# Patient Record
Sex: Female | Born: 1985 | Race: Black or African American | Hispanic: Yes | Marital: Single | Smoking: Never smoker
Health system: Southern US, Community
[De-identification: ages and names within clinical notes are randomized; demographics above are authoritative.]

## PROBLEM LIST (undated history)

## (undated) DIAGNOSIS — E079 Disorder of thyroid, unspecified: Secondary | ICD-10-CM

## (undated) DIAGNOSIS — F419 Anxiety disorder, unspecified: Secondary | ICD-10-CM

## (undated) HISTORY — PX: NO PAST SURGERIES: SHX2092

---

## 2015-08-05 ENCOUNTER — Emergency Department (HOSPITAL_COMMUNITY)
Admission: EM | Admit: 2015-08-05 | Discharge: 2015-08-06 | Disposition: A | Discharge status: Home / Self Care | Payer: Medicaid - Out of State | Attending: Emergency Medicine | Admitting: Emergency Medicine

## 2015-08-05 ENCOUNTER — Emergency Department (HOSPITAL_COMMUNITY): Payer: Medicaid - Out of State

## 2015-08-05 ENCOUNTER — Encounter (HOSPITAL_COMMUNITY): Payer: Self-pay | Admitting: *Deleted

## 2015-08-05 DIAGNOSIS — R42 Dizziness and giddiness: Secondary | ICD-10-CM | POA: Diagnosis not present

## 2015-08-05 DIAGNOSIS — R000 Tachycardia, unspecified: Secondary | ICD-10-CM | POA: Diagnosis not present

## 2015-08-05 DIAGNOSIS — R0600 Dyspnea, unspecified: Secondary | ICD-10-CM | POA: Insufficient documentation

## 2015-08-05 DIAGNOSIS — R079 Chest pain, unspecified: Secondary | ICD-10-CM | POA: Insufficient documentation

## 2015-08-05 DIAGNOSIS — R0602 Shortness of breath: Secondary | ICD-10-CM | POA: Diagnosis present

## 2015-08-05 DIAGNOSIS — R61 Generalized hyperhidrosis: Secondary | ICD-10-CM | POA: Insufficient documentation

## 2015-08-05 DIAGNOSIS — Z3202 Encounter for pregnancy test, result negative: Secondary | ICD-10-CM | POA: Insufficient documentation

## 2015-08-05 MED ORDER — ONDANSETRON HCL 4 MG/2ML IJ SOLN
4.0000 mg | Freq: Once | INTRAMUSCULAR | Status: AC
  Administered 2015-08-05: 4 mg via INTRAVENOUS
  Filled 2015-08-05: qty 2

## 2015-08-05 NOTE — ED Notes (Signed)
Pt complains of stabbing pain in her chest and shortness of breath since 10PM tonight. Pt states the pain is worse when she takes a deep breath.

## 2015-08-05 NOTE — ED Notes (Signed)
Patient transported to X-ray 

## 2015-08-05 NOTE — ED Notes (Signed)
Nurse drawing labs. 

## 2015-08-05 NOTE — ED Provider Notes (Signed)
CSN: [id number]     Arrival date & time 08/05/15  2230 History  This chart was scribed for Marisa Severin, MD by Doreatha Martin, ED Scribe. This patient was seen in room WA14/WA14 and the patient's care was started at 11:06 PM.    Chief Complaint  Patient presents with  . Chest Pain  . Shortness of Breath   The history is provided by the patient. No language interpreter was used.    HPI Comments: Sheri Brock is a 29 y.o. female who presents to the Emergency Department complaining of moderate, stabbing CP onset 45 minutes PTA while at rest with associated SOB, dizziness, diaphoresis. She notes a past Hx of bronchitis, but not recently. No Hx of similar symptoms. No oral contraceptive use, no FHx of PE/DVT. She notes a FHx of stroke on her grandmother's side. Pt is a non-smoker. She denies leg swelling, leg pain.  History reviewed. No pertinent past medical history. History reviewed. No pertinent past surgical history. No family history on file. Social History  Substance Use Topics  . Smoking status: Never Smoker   . Smokeless tobacco: None  . Alcohol Use: Yes   OB History    No data available     Review of Systems  Constitutional: Positive for diaphoresis.  Respiratory: Positive for shortness of breath.   Cardiovascular: Positive for chest pain. Negative for leg swelling.  Musculoskeletal: Negative for arthralgias ( No leg pain).  Neurological: Positive for dizziness.  All other systems reviewed and are negative.  Allergies  Review of patient's allergies indicates no known allergies.  Home Medications   Prior to Admission medications   Not on File   BP 93/68 mmHg  Pulse 124  Temp(Src) 98 F (36.7 C) (Oral)  Resp 28  SpO2 100%  LMP 07/10/2015 Physical Exam  Constitutional: She is oriented to person, place, and time. She appears well-developed and well-nourished.  HENT:  Head: Normocephalic and atraumatic.  Nose: Nose normal.  Mouth/Throat: Oropharynx is clear and  moist.  Eyes: Conjunctivae and EOM are normal. Pupils are equal, round, and reactive to light.  Neck: Normal range of motion. Neck supple. No JVD present. No tracheal deviation present. No thyromegaly present.  Cardiovascular: Regular rhythm, normal heart sounds and intact distal pulses.  Exam reveals no gallop and no friction rub.   No murmur heard. tachycardia  Pulmonary/Chest: Effort normal. No stridor. No respiratory distress. She has no wheezes. She has no rales. She exhibits no tenderness.  Tachypnea, diminished in bases, no wheezing, cough present  Abdominal: Soft. Bowel sounds are normal. She exhibits no distension and no mass. There is no tenderness. There is no rebound and no guarding.  Musculoskeletal: Normal range of motion. She exhibits no edema or tenderness.  Lymphadenopathy:    She has no cervical adenopathy.  Neurological: She is alert and oriented to person, place, and time. She displays normal reflexes. She exhibits normal muscle tone. Coordination normal.  Skin: Skin is warm and dry. No rash noted. No erythema. No pallor.  Psychiatric: She has a normal mood and affect. Her behavior is normal. Judgment and thought content normal.  Nursing note and vitals reviewed.   ED Course  Procedures (including critical care time) DIAGNOSTIC STUDIES: Oxygen Saturation is 100% on RA, normal by my interpretation.    COORDINATION OF CARE: 11:09 PM Discussed treatment plan with pt at bedside and pt agreed to plan.   Labs Review Labs Reviewed  BASIC METABOLIC PANEL - Abnormal; Notable for the following:  Glucose, Bld 108 (*)    Creatinine, Ser 0.42 (*)    All other components within normal limits  CBC - Abnormal; Notable for the following:    Hemoglobin 10.6 (*)    HCT 33.5 (*)    MCV 74.1 (*)    MCH 23.5 (*)    All other components within normal limits  TSH - Abnormal; Notable for the following:    TSH 0.010 (*)    All other components within normal limits  T3  T4   I-STAT TROPOININ, ED  I-STAT BETA HCG BLOOD, ED ([facility], [facility], [facility] ONLY)    Imaging Review Dg Chest 2 View  08/06/2015   CLINICAL DATA:  Left-sided chest pain and nonproductive cough for 2 hours.  EXAM: CHEST  2 VIEW  COMPARISON:  None.  FINDINGS: The cardiomediastinal contours are normal. The lungs are clear. Pulmonary vasculature is normal. No consolidation, pleural effusion, or pneumothorax. No acute osseous abnormalities are seen.  IMPRESSION: No acute pulmonary process.   Electronically Signed   By: Rubye Oaks M.D.   On: 08/06/2015 00:54   Ct Angio Chest Pe W/cm &/or Wo Cm  08/06/2015   CLINICAL DATA:  Acute onset of pleuritic chest pain. Tachycardia and dyspnea.  EXAM: CT ANGIOGRAPHY CHEST WITH CONTRAST  TECHNIQUE: Multidetector CT imaging of the chest was performed using the standard protocol during bolus administration of intravenous contrast. Multiplanar CT image reconstructions and MIPs were obtained to evaluate the vascular anatomy.  CONTRAST:  OMNIPAQUE IOHEXOL 350 MG/ML SOLN  COMPARISON:  Radiographs earlier this day.  FINDINGS: There are no filling defects within central the pulmonary arteries to suggest pulmonary embolus. The distal segmental and subsegmental branches cannot be assessed due to contrast bolus timing and soft tissue attenuation from body habitus.  The heart size is normal. Thoracic aorta is normal in caliber. There is an anterior mediastinal mass measuring 4.9 x 3.1 x 1.8 cm, irregular and slightly triangular in shape. This measures fairly homogeneous soft tissue density without calcifications or definite fat component. No mediastinal or hilar adenopathy. Small bilateral axillary lymph nodes, not enlarged by size criteria. Small right subpectoral lymph nodes.  The lungs are clear.  No consolidation, pulmonary nodule or mass.  Mild diffuse enlargement of thyroid gland without dominant nodule. Esophagus is decompressed. No acute abnormality in the included upper abdomen.   There are no acute or suspicious osseous abnormalities.  Review of the MIP images confirms the above findings.  IMPRESSION: 1. No central pulmonary embolus. The distal segmental and subsegmental branches cannot be assessed. 2. 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 This may reflect residual or recurrent thymus, thymoma, or less likely lymphoma. There are small axillary lymph nodes, otherwise no adenopathy. Follow-up CT could be considered in 3 months to evaluate for imaging stability. Alternatively, PET-CT could be considered to evaluate for metabolic activity.   Electronically Signed   By: Rubye Oaks M.D.   On: 08/06/2015 02:20   I have personally reviewed and evaluated these images and lab results as part of my medical decision-making.   EKG Interpretation   Date/Time:  Saturday August 05 2015 22:45:34 EDT Ventricular Rate:  126 PR Interval:  142 QRS Duration: 85 QT Interval:  363 QTC Calculation: 526 R Axis:   24 Text Interpretation:  Sinus tachycardia Nonspecific T abnormalities,  anterior leads Prolonged QT interval Baseline wander in lead(s) II III aVF  No old tracing to compare Confirmed by Seleny Allbright  MD, Vickye Astorino ([id number]) on  08/05/2015  10:50:00 PM      MDM   Final diagnoses:  Dyspnea  Tachycardia    I personally performed the services described in this documentation, which was scribed in my presence. The recorded information has been reviewed and is accurate.  29 yo female with acute onset of sharp pleuritic chest pain, sob, tachycardia.  Reports h/o bronchitis, does not smoke.  No specific PE risks, but has clinical picture concerning for PE.  Plan for labs, cxr, and most likely CTA chest.  No signs of PE.  Soft tissue mass possible thymus.  On re-eval, thyroid is enlarged.  Will send thyroid panel, and refer to PCP.    Marisa Severin, MD 08/06/15 [phone]

## 2015-08-06 ENCOUNTER — Emergency Department (HOSPITAL_COMMUNITY): Payer: Medicaid - Out of State

## 2015-08-06 LAB — I-STAT TROPONIN, ED: Troponin i, poc: 0 ng/mL (ref 0.00–0.08)

## 2015-08-06 LAB — TSH: TSH: 0.01 u[IU]/mL — ABNORMAL LOW (ref 0.350–4.500)

## 2015-08-06 LAB — CBC
HEMATOCRIT: 33.5 % — AB (ref 36.0–46.0)
Hemoglobin: 10.6 g/dL — ABNORMAL LOW (ref 12.0–15.0)
MCH: 23.5 pg — ABNORMAL LOW (ref 26.0–34.0)
MCHC: 31.6 g/dL (ref 30.0–36.0)
MCV: 74.1 fL — AB (ref 78.0–100.0)
PLATELETS: 258 10*3/uL (ref 150–400)
RBC: 4.52 MIL/uL (ref 3.87–5.11)
RDW: 13.3 % (ref 11.5–15.5)
WBC: 6.6 10*3/uL (ref 4.0–10.5)

## 2015-08-06 LAB — BASIC METABOLIC PANEL
Anion gap: 5 (ref 5–15)
BUN: 13 mg/dL (ref 6–20)
CHLORIDE: 107 mmol/L (ref 101–111)
CO2: 23 mmol/L (ref 22–32)
CREATININE: 0.42 mg/dL — AB (ref 0.44–1.00)
Calcium: 8.9 mg/dL (ref 8.9–10.3)
GFR calc Af Amer: >60 mL/min (ref >60)
GLUCOSE: 108 mg/dL — AB (ref 65–99)
POTASSIUM: 3.9 mmol/L (ref 3.5–5.1)
Sodium: 135 mmol/L (ref 135–145)

## 2015-08-06 LAB — I-STAT BETA HCG BLOOD, ED (MC, WL, AP ONLY): I-stat hCG, quantitative: <5 m[IU]/mL (ref <5)

## 2015-08-06 MED ORDER — KETOROLAC TROMETHAMINE 30 MG/ML IJ SOLN: 30.0000 mg | Freq: Once | INTRAMUSCULAR | Status: DC

## 2015-08-06 MED ORDER — IOHEXOL 350 MG/ML SOLN
100.0000 mL | Freq: Once | INTRAVENOUS | Status: AC | PRN
  Administered 2015-08-06: 100 mL via INTRAVENOUS

## 2015-08-06 MED ORDER — MORPHINE SULFATE (PF) 4 MG/ML IV SOLN: 4.0000 mg | Freq: Once | INTRAVENOUS | Status: DC

## 2015-08-06 MED ORDER — SODIUM CHLORIDE 0.9 % IV BOLUS (SEPSIS)
1000.0000 mL | Freq: Once | INTRAVENOUS | Status: AC
  Administered 2015-08-06: 1000 mL via INTRAVENOUS

## 2015-08-06 NOTE — Discharge Instructions (Signed)
Your workup today has not shown a specific cause for your symptoms.  Your thyroid appears large on exam today, and you had thyroid labs drawn.  These will need to be followed up by a primary care doctor.   Nonspecific Tachycardia Tachycardia is a faster than normal heartbeat (more than 100 beats per minute). In adults, the heart normally beats between 60 and 100 times a minute. A fast heartbeat may be a normal response to exercise or stress. It does not necessarily mean that something is wrong. However, sometimes when your heart beats too fast it may not be able to pump enough blood to the rest of your body. This can result in chest pain, shortness of breath, dizziness, and even fainting. Nonspecific tachycardia means that the specific cause or pattern of your tachycardia is unknown. CAUSES  Tachycardia may be harmless or it may be due to a more serious underlying cause. Possible causes of tachycardia include:  Exercise or exertion.  Fever.  Pain or injury.  Infection.  Loss of body fluids (dehydration).  Overactive thyroid.  Lack of red blood cells (anemia).  Anxiety and stress.  Alcohol.  Caffeine.  Tobacco products.  Diet pills.  Illegal drugs.  Heart disease. SYMPTOMS  Rapid or irregular heartbeat (palpitations).  Suddenly feeling your heart beating (cardiac awareness).  Dizziness.  Tiredness (fatigue).  Shortness of breath.  Chest pain.  Nausea.  Fainting. DIAGNOSIS  Your caregiver will perform a physical exam and take your medical history. In some cases, a heart specialist (cardiologist) may be consulted. Your caregiver may also order:  Blood tests.  Electrocardiography. This test records the electrical activity of your heart.  A heart monitoring test. TREATMENT  Treatment will depend on the likely cause of your tachycardia. The goal is to treat the underlying cause of your tachycardia. Treatment methods may include:  Replacement of fluids or blood  through an intravenous (IV) tube for moderate to severe dehydration or anemia.  New medicines or changes in your current medicines.  Diet and lifestyle changes.  Treatment for certain infections.  Stress relief or relaxation methods. HOME CARE INSTRUCTIONS   Rest.  Drink enough fluids to keep your urine clear or pale yellow.  Do not smoke.  Avoid:  Caffeine.  Tobacco.  Alcohol.  Chocolate.  Stimulants such as over-the-counter diet pills or pills that help you stay awake.  Situations that cause anxiety or stress.  Illegal drugs such as marijuana, phencyclidine (PCP), and cocaine.  Only take medicine as directed by your caregiver.  Keep all follow-up appointments as directed by your caregiver. SEEK IMMEDIATE MEDICAL CARE IF:   You have pain in your chest, upper arms, jaw, or neck.  You become weak, dizzy, or feel faint.  You have palpitations that will not go away.  You vomit, have diarrhea, or pass blood in your stool.  Your skin is cool, pale, and wet.  You have a fever that will not go away with rest, fluids, and medicine. MAKE SURE YOU:   Understand these instructions.  Will watch your condition.  Will get help right away if you are not doing well or get worse. Document Released: 01/02/2005 Document Revised: 02/17/2012 Document Reviewed: 11/05/2011 [facility] Patient Information 2015 [place], [place]. This information is not intended to replace advice given to you by your health care provider. Make sure you discuss any questions you have with your health care provider.  Shortness of Breath Shortness of breath means you have trouble breathing. It could also mean that  you have a medical problem. You should get immediate medical care for shortness of breath. CAUSES   Not enough oxygen in the air such as with high altitudes or a smoke-filled room.  Certain lung diseases, infections, or problems.  Heart disease or conditions, such as angina or heart  failure.  Low red blood cells (anemia).  Poor physical fitness, which can cause shortness of breath when you exercise.  Chest or back injuries or stiffness.  Being overweight.  Smoking.  Anxiety, which can make you feel like you are not getting enough air. DIAGNOSIS  Serious medical problems can often be found during your physical exam. Tests may also be done to determine why you are having shortness of breath. Tests may include:  Chest X-rays.  Lung function tests.  Blood tests.  An electrocardiogram (ECG).  An ambulatory electrocardiogram. An ambulatory ECG records your heartbeat patterns over a 24-hour period.  Exercise testing.  A transthoracic echocardiogram (TTE). During echocardiography, sound waves are used to evaluate how blood flows through your heart.  A transesophageal echocardiogram (TEE).  Imaging scans. Your health care provider may not be able to find a cause for your shortness of breath after your exam. In this case, it is important to have a follow-up exam with your health care provider as directed.  TREATMENT  Treatment for shortness of breath depends on the cause of your symptoms and can vary greatly. HOME CARE INSTRUCTIONS   Do not smoke. Smoking is a common cause of shortness of breath. If you smoke, ask for help to quit.  Avoid being around chemicals or things that may bother your breathing, such as paint fumes and dust.  Rest as needed. Slowly resume your usual activities.  If medicines were prescribed, take them as directed for the full length of time directed. This includes oxygen and any inhaled medicines.  Keep all follow-up appointments as directed by your health care provider. SEEK MEDICAL CARE IF:   Your condition does not improve in the time expected.  You have a hard time doing your normal activities even with rest.  You have any new symptoms. SEEK IMMEDIATE MEDICAL CARE IF:   Your shortness of breath gets worse.  You feel  light-headed, faint, or develop a cough not controlled with medicines.  You start coughing up blood.  You have pain with breathing.  You have chest pain or pain in your arms, shoulders, or abdomen.  You have a fever.  You are unable to walk up stairs or exercise the way you normally do. MAKE SURE YOU:  Understand these instructions.  Will watch your condition.  Will get help right away if you are not doing well or get worse. Document Released: 08/20/2001 Document Revised: 11/30/2013 Document Reviewed: 02/10/2012 [facility] Patient Information 2015 [place], [place]. This information is not intended to replace advice given to you by your health care provider. Make sure you discuss any questions you have with your health care provider.   Emergency Department Resource Guide 1) Find a Doctor and Pay Out of Pocket Although you won't have to find out who is covered by your insurance plan, it is a good idea to ask around and get recommendations. You will then need to call the office and see if the doctor you have chosen will accept you as a new patient and what types of options they offer for patients who are self-pay. Some doctors offer discounts or will set up payment plans for their patients who do not have insurance,  but you will need to ask so you aren't surprised when you get to your appointment.  2) Contact Your Local Health Department Not all health departments have doctors that can see patients for sick visits, but many do, so it is worth a call to see if yours does. If you don't know where your local health department is, you can check in your phone book. The CDC also has a tool to help you locate your state's health department, and many state websites also have listings of all of their local health departments.  3) Find a Walk-in Clinic If your illness is not likely to be very severe or complicated, you may want to try a walk in clinic. These are popping up all over the country in  pharmacies, drugstores, and shopping centers. They're usually staffed by nurse practitioners or physician assistants that have been trained to treat common illnesses and complaints. They're usually fairly quick and inexpensive. However, if you have serious medical issues or chronic medical problems, these are probably not your best option.  No Primary Care Doctor: - Call Health Connect at  [phone] - they can help you locate a primary care doctor that  accepts your insurance, provides certain services, etc. - Physician Referral Service- [phone]  Chronic Pain Problems: Organization         Address  Phone   Notes  [facility]  [phone] Patients need to be referred by their primary care doctor.   Medication Assistance: Organization         Address  Phone   Notes  [facility] Medication [facility] [address] [place] [place], [place] [address] [phone] --Must be a resident of [facility] -- Must have NO insurance coverage whatsoever (no Medicaid/ Medicare, etc.) -- The pt. MUST have a primary care doctor that directs their care regularly and follows them in the community   [facility]ist  [phone]   [employer]  [phone]    Agencies that provide inexpensive medical care: Organization         Address  Phone   Notes  [facility]  [phone]   [facility]    [phone]   [facility] [address] [place], [place] [address] [phone]   [facility] [address] [place], [place] [phone]   Planned Parenthood    [phone]   [facility]    [phone]   [facility] and [facility]  [address], [place] Phone:  [phone], Fax:  [phone] Hours of Operation:  9 am - 6 pm, M-F.  Also accepts Medicaid/Medicare and self-pay.  [facility]  [address],  [place] Phone: [phone], Fax: [phone]. Hours of Operation:  8:30 am - 5:30 pm, M-F.  Also accepts Medicaid and self-pay.  [facility] [place] [address], [place] Phone: [phone]   [facility] [address] Natasha Bence [place], [place] [phone], Ext. [phone] Mondays & Thursdays: 7-9 AM.  First 15 patients are seen on a first come, first serve basis.    Medicaid-accepting [facility] Providers:  Organization         Address  Phone   Notes  [facility] [address], [place] [phone] Also accepts self-pay patients.  [facility] [address] [place],  [address] [place]  [phone]   [facility] [address], [place] [phone]   [facility] [address], [place] [phone]   Renaye Rakers [address], [place]   [phone] Only accepts [place] Access [place] patients after they have their name applied to their card.   Self-Pay (no insurance) in [facility]:  Organization         Address  Phone   Notes  Sickle Cell Patients, [facility] Internal Medicine [address] [place], [place] [phone]   [facility] [address] [place], [place] [phone]   [facility] [place]  [address], [place] [phone]   [facility]/Dr. Osei-Bonsu  [address] [place] or [address], [place] [phone] Phone number for both [place] and [place] locations is the same.  [facility] [address] [place] [phone]   [facility] [address] [place] or [address] [phone]   [facility] [address] [place] [phone], phone; [phone], fax Sees patients 1st and 3rd Saturday of every month.  Must not  qualify for public or private insurance (i.e. Medicaid, Medicare, [place] Health Choice, Veterans' Benefits)  Household income should be no more than 200% of the poverty level The clinic cannot treat you if you are pregnant or think you are pregnant  Sexually transmitted diseases are not treated at the clinic.    Dental Care: Organization         Address  Phone  Notes  [facility] Department of [facility] [address] [place], [place] [phone] Accepts children up to age 60 who are enrolled in [place] or [place] Health Choice; pregnant women with a Medicaid card; and children who have applied for Medicaid or [place] Health Choice, but were declined, whose parents can pay a reduced fee at time of service.  [facility] Department of [facility]  [address], [place] [phone] Accepts children up to age 19 who are enrolled in [place] or [place] Health Choice; pregnant women with a Medicaid card; and children who have applied for Medicaid or  Health Choice, but were declined, whose parents can pay a reduced fee at time of service.  [facility]  [address] [place], [place] [phone] Patients are seen by appointment only. Walk-ins are not accepted. [facility] will see patients 63 years of age and older. Monday - Tuesday (8am-5pm) Most Wednesdays (8:30-5pm) $30 per visit, cash only  [facility] Adult [facility]  [address], [facility] [phone] Patients are seen by appointment only. Walk-ins are not accepted. [facility] will see patients 47 years of age and older. One Wednesday Evening (Monthly: Volunteer Based).  $30 per visit, cash only  [employer]  [phone] for adults; Children under age 66, call Graduate Pediatric Dentistry at [phone]. Children aged 57-14, please call [phone] to request a pediatric application.  Dental services are provided  in all areas of dental care including fillings, crowns and bridges, complete and partial dentures, implants, gum treatment, root canals, and extractions. Preventive care is also provided. Treatment is provided to both adults and children. Patients are selected via a  lottery and there is often a waiting list.   [facility] [address], [place]  [phone] [email or web address]   [facility] [address] [place], [place] [phone], Ext. [phone] Second and Fourth Thursday of each month, opens at 6:30 AM; Clinic ends at 9 AM.  Patients are seen on a first-come first-served basis, and a limited number are seen during each clinic.   [facility]  [address] Ether Griffins [place], [place] [phone]   Eligibility Requirements You must have lived in [place], [place], or [place] counties for at least the last three months.   You cannot be eligible for state or federal sponsored [place], including [employer], [place], or [employer].   You generally cannot be eligible for healthcare insurance through your employer.    How to apply: Eligibility screenings are held every Tuesday and Wednesday afternoon from 1:00 pm until 4:00 pm. You do not need an appointment for the interview!  [facility] [address] [place], [place] [phone]   [facility] Health Department  [phone]   [facility] Health Department  [phone]   [facility] Health Department  [phone]    Behavioral Health Resources in the Community: Intensive Outpatient Programs Organization         Address  Phone  Notes  [facility] [address] [place], [place] [phone]   [facility] Outpatient [address] [place], [place] [phone]   [facility] [address], [place], [place]  [phone]   [facility] [address]  [place], [place]  [phone] or [phone]   Substance Abuse Resources Organization         Address  Phone  Notes  [facility]  [phone]   [facility]  [phone]   [facility] [place]  [phone]   Floydene Flock  [phone]   Residential & Outpatient Substance Abuse Program  [phone]   Psychological Services Organization         Address  Phone  Notes  [facility] Behavioral Health  [phone]   [facility] Services  [phone]   [facility] Mental Health [address], [place] [phone] or [phone]    Mobile Crisis Teams Organization         Address  Phone  Notes  [facility], Mobile Crisis Care Unit  [phone]   [facility]  [address] [place], [place] [phone]   Doristine Locks [address] [place] [phone]    Self-Help/Support Groups Organization         Address  Phone             Notes  [facility] [place] - variety of support groups  [phone]- I7437963 Call for more information  Narcotics Anonymous (NA), [facility] [address], [employer] [place]  2 meetings at this location   Statistician         Address  Phone  Notes  [facility] [address],    [place]  [phone]   [facility]  [address], [place] [address], [place], [place] [phone]   [facility] [address] [place], [place] [phone] Admissions: 8am-3pm M-F  [facility] [address]    [place], [place] [phone]   [facility] [address] [place] [address], [place],  [place] [phone]   [facility] [address],  [place], [place] [phone]   [facility] - Intensive Outpatient [address] Laure[address], [place], [place] [phone]   [facility] ([facility] [address] [place]  [place], [place] [phone] or  [phone]   [facility] ([facility]) [address] [place], [place] [phone] Accepts Medicaid  [facility] [address],  [place] [phone] Substance Abuse/Addiction Treatment   [facility] Organization         Address  Phone  Notes  [facility]  [phone]   Angie Fava, PhD [address] Ervin Knack [place], [place]   [phone] or [phone]   [facility] Behavioral   [address] [place], [place] [phone]   [facility] [address], [place], [place] [phone] Insurance/Medicaid/sponsorship through [facility] and Families [address]                                    [place], [place] [phone] Therapy/tele-psych/case  [facility] [address], [place] [phone]    Dr. Lolly Mustache  [phone]   [facility] [place]  [employer] [facility] Dept. 1) [address],  2) [address], [place] 3)  [address], [place] [phone]  [phone]   [facility] Child Abuse Hotline 805-427-1061 or [phone] (After Hours)

## 2015-08-07 LAB — T4: T4 TOTAL: 22.7 ug/dL — AB (ref 4.5–12.0)

## 2015-08-07 LAB — T3: T3 TOTAL: 605 ng/dL — AB (ref 71–180)

## 2015-10-18 ENCOUNTER — Encounter: Payer: Self-pay | Admitting: Obstetrics & Gynecology

## 2016-01-08 ENCOUNTER — Emergency Department (HOSPITAL_COMMUNITY)
Admission: EM | Admit: 2016-01-08 | Discharge: 2016-01-08 | Disposition: A | Discharge status: Home / Self Care | Payer: Medicaid Other | Attending: Emergency Medicine | Admitting: Emergency Medicine

## 2016-01-08 ENCOUNTER — Emergency Department (HOSPITAL_COMMUNITY): Payer: Medicaid Other

## 2016-01-08 DIAGNOSIS — J209 Acute bronchitis, unspecified: Secondary | ICD-10-CM

## 2016-01-08 DIAGNOSIS — J0100 Acute maxillary sinusitis, unspecified: Secondary | ICD-10-CM | POA: Insufficient documentation

## 2016-01-08 DIAGNOSIS — R51 Headache: Secondary | ICD-10-CM | POA: Diagnosis present

## 2016-01-08 MED ORDER — AMOXICILLIN 500 MG PO CAPS: 500.0000 mg | ORAL_CAPSULE | Freq: Three times a day (TID) | ORAL | Status: DC

## 2016-01-08 NOTE — ED Notes (Signed)
Pt not in room when RN came to triage pt.

## 2016-01-08 NOTE — ED Provider Notes (Signed)
CSN: [id number]     Arrival date & time 01/08/16  1046 History   First MD Initiated Contact with Patient 01/08/16 1155     Chief Complaint  Patient presents with  . Hemoptysis  . Headache     (Consider location/radiation/quality/duration/timing/severity/associated sxs/prior Treatment) HPI   Sheri Brock is a 30 y.o. female presents for evaluation of rhinorrhea, bleeding from nose, cough, sputum production with bleeding, ear pain and sore throat. She's been ill for several days. She denies nausea, vomiting, weakness or dizziness. No similar in past. There are no other known modifying factors.   No past medical history on file. No past surgical history on file. No family history on file. Social History  Substance Use Topics  . Smoking status: Never Smoker   . Smokeless tobacco: Not on file  . Alcohol Use: Yes   OB History    No data available     Review of Systems  All other systems reviewed and are negative.     Allergies  Review of patient's allergies indicates no known allergies.  Home Medications   Prior to Admission medications   Not on File   BP 140/74 mmHg  Pulse 106  Temp(Src) 97.9 F (36.6 C) (Oral)  Resp 16  SpO2 100%  LMP 01/01/2016 Physical Exam  Constitutional: She is oriented to person, place, and time. She appears well-developed and well-nourished.  HENT:  Head: Normocephalic and atraumatic.  Right Ear: External ear normal.  Left Ear: External ear normal.  No tonsillar hypertrophy. Tympanic membranes are normal bilaterally. There is no material, or significant cerumen in the auditory canals. There is mild tenderness to percussion over the maxillary sinuses bilaterally.  Eyes: Conjunctivae and EOM are normal. Pupils are equal, round, and reactive to light.  Neck: Normal range of motion and phonation normal. Neck supple.  Cardiovascular: Normal rate, regular rhythm and normal heart sounds.   Pulmonary/Chest: Effort normal and breath sounds  normal. She exhibits no bony tenderness.  Abdominal: Soft. There is no tenderness.  Musculoskeletal: Normal range of motion.  Neurological: She is alert and oriented to person, place, and time. No cranial nerve deficit or sensory deficit. She exhibits normal muscle tone. Coordination normal.  Skin: Skin is warm, dry and intact.  Psychiatric: She has a normal mood and affect. Her behavior is normal. Judgment and thought content normal.  Nursing note and vitals reviewed.   ED Course  Procedures (including critical care time)  Medications - No data to display  Patient Vitals for the past 24 hrs:  BP Temp Temp src Pulse Resp SpO2  01/08/16 1057 140/74 mmHg 97.9 F (36.6 C) Oral 106 16 100 %    12:41 PM Reevaluation with update and discussion. After initial assessment and treatment, an updated evaluation reveals findings discussed with patient, all questions answered. Sheri Brock L    Labs Review Labs Reviewed - No data to display  Imaging Review Dg Chest 2 View  01/08/2016  CLINICAL DATA:  Pt c/o cough with blood, and headache, postural dizziness, dizziness in shower, right ear pain x 1 month. No chest hx. Non-smoker. EXAM: CHEST  2 VIEW COMPARISON:  08/06/2015 plain film and CT. FINDINGS: Midline trachea. Normal heart size and mediastinal contours. No pleural effusion or pneumothorax. Clear lungs. IMPRESSION: No acute cardiopulmonary disease. 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 Electronically Signed   By: Jeronimo Greaves M.D.   On: 01/08/2016 12:31   I have personally reviewed  and evaluated these images and lab results as part of my medical decision-making.   EKG Interpretation None      MDM   Final diagnoses:  Acute maxillary sinusitis, recurrence not specified  Acute bronchitis, unspecified organism    Respiratory infection, sinus, without signs for systemic illness. Bleeding with sputum, and nasal  drainage, unlikely to represent a significant blood loss. She is a nonsmoker.  Nursing Notes Reviewed/ Care Coordinated Applicable Imaging Reviewed Interpretation of Laboratory Data incorporated into ED treatment  The patient appears reasonably screened and/or stabilized for discharge and I doubt any other medical condition or other [facility] requiring further screening, evaluation, or treatment in the ED at this time prior to discharge.  Plan: Home Medications- AMOX, Sudafed; Home Treatments- rest; return here if the recommended treatment, does not improve the symptoms; Recommended follow up- PCP prn     Sheri Bale, MD 01/08/16 1244

## 2016-01-08 NOTE — ED Notes (Addendum)
Pt c/o cough with blood, and headache, postural dizziness, dizziness in shower, right ear pain x 1 month. Right TM intact, pearly gray, cone of light in appropriate position, fullness visulaized. Pt states that blood is only seen with cough. Denies chills, fevers, night sweats.  On exam, sinus tenderness present and tonsils enlarged.

## 2016-01-08 NOTE — Discharge Instructions (Signed)
Use a decongestant medications such as Sudafed, 3 or 4 times a day for nasal congestion. Get plenty of rest, and drink a lot fluids.    Acute Bronchitis Bronchitis is inflammation of the airways that extend from the windpipe into the lungs (bronchi). The inflammation often causes mucus to develop. This leads to a cough, which is the most common symptom of bronchitis.  In acute bronchitis, the condition usually develops suddenly and goes away over time, usually in a couple weeks. Smoking, allergies, and asthma can make bronchitis worse. Repeated episodes of bronchitis may cause further lung problems.  CAUSES Acute bronchitis is most often caused by the same virus that causes a cold. The virus can spread from person to person (contagious) through coughing, sneezing, and touching contaminated objects. SIGNS AND SYMPTOMS   Cough.   Fever.   Coughing up mucus.   Body aches.   Chest congestion.   Chills.   Shortness of breath.   Sore throat.  DIAGNOSIS  Acute bronchitis is usually diagnosed through a physical exam. Your health care provider will also ask you questions about your medical history. Tests, such as chest X-rays, are sometimes done to rule out other conditions.  TREATMENT  Acute bronchitis usually goes away in a couple weeks. Oftentimes, no medical treatment is necessary. Medicines are sometimes given for relief of fever or cough. Antibiotic medicines are usually not needed but may be prescribed in certain situations. In some cases, an inhaler may be recommended to help reduce shortness of breath and control the cough. A cool mist vaporizer may also be used to help thin bronchial secretions and make it easier to clear the chest.  HOME CARE INSTRUCTIONS  Get plenty of rest.   Drink enough fluids to keep your urine clear or pale yellow (unless you have a medical condition that requires fluid restriction). Increasing fluids may help thin your respiratory secretions  (sputum) and reduce chest congestion, and it will prevent dehydration.   Take medicines only as directed by your health care provider.  If you were prescribed an antibiotic medicine, finish it all even if you start to feel better.  Avoid smoking and secondhand smoke. Exposure to cigarette smoke or irritating chemicals will make bronchitis worse. If you are a smoker, consider using nicotine gum or skin patches to help control withdrawal symptoms. Quitting smoking will help your lungs heal faster.   Reduce the chances of another bout of acute bronchitis by washing your hands frequently, avoiding people with cold symptoms, and trying not to touch your hands to your mouth, nose, or eyes.   Keep all follow-up visits as directed by your health care provider.  SEEK MEDICAL CARE IF: Your symptoms do not improve after 1 week of treatment.  SEEK IMMEDIATE MEDICAL CARE IF:  You develop an increased fever or chills.   You have chest pain.   You have severe shortness of breath.  You have bloody sputum.   You develop dehydration.  You faint or repeatedly feel like you are going to pass out.  You develop repeated vomiting.  You develop a severe headache. MAKE SURE YOU:   Understand these instructions.  Will watch your condition.  Will get help right away if you are not doing well or get worse.   This information is not intended to replace advice given to you by your health care provider. Make sure you discuss any questions you have with your health care provider.   Document Released: 01/02/2005 Document Revised: 12/16/2014 Document  Reviewed: 05/18/2013 Elsevier Interactive Patient Education 2016 [employer]  Sinusitis, Adult Sinusitis is redness, soreness, and inflammation of the paranasal sinuses. Paranasal sinuses are air pockets within the bones of your face. They are located beneath your eyes, in the middle of your forehead, and above your eyes. In healthy paranasal  sinuses, mucus is able to drain out, and air is able to circulate through them by way of your nose. However, when your paranasal sinuses are inflamed, mucus and air can become trapped. This can allow bacteria and other germs to grow and cause infection. Sinusitis can develop quickly and last only a short time (acute) or continue over a long period (chronic). Sinusitis that lasts for more than 12 weeks is considered chronic. CAUSES Causes of sinusitis include:  Allergies.  Structural abnormalities, such as displacement of the cartilage that separates your nostrils (deviated septum), which can decrease the air flow through your nose and sinuses and affect sinus drainage.  Functional abnormalities, such as when the small hairs (cilia) that line your sinuses and help remove mucus do not work properly or are not present. SIGNS AND SYMPTOMS Symptoms of acute and chronic sinusitis are the same. The primary symptoms are pain and pressure around the affected sinuses. Other symptoms include:  Upper toothache.  Earache.  Headache.  Bad breath.  Decreased sense of smell and taste.  A cough, which worsens when you are lying flat.  Fatigue.  Fever.  Thick drainage from your nose, which often is green and may contain pus (purulent).  Swelling and warmth over the affected sinuses. DIAGNOSIS Your health care provider will perform a physical exam. During your exam, your health care provider may perform any of the following to help determine if you have acute sinusitis or chronic sinusitis:  Look in your nose for signs of abnormal growths in your nostrils (nasal polyps).  Tap over the affected sinus to check for signs of infection.  View the inside of your sinuses using an imaging device that has a light attached (endoscope). If your health care provider suspects that you have chronic sinusitis, one or more of the following tests may be recommended:  Allergy tests.  Nasal culture. A sample  of mucus is taken from your nose, sent to a lab, and screened for bacteria.  Nasal cytology. A sample of mucus is taken from your nose and examined by your health care provider to determine if your sinusitis is related to an allergy. TREATMENT Most cases of acute sinusitis are related to a viral infection and will resolve on their own within 10 days. Sometimes, medicines are prescribed to help relieve symptoms of both acute and chronic sinusitis. These may include pain medicines, decongestants, nasal steroid sprays, or saline sprays. However, for sinusitis related to a bacterial infection, your health care provider will prescribe antibiotic medicines. These are medicines that will help kill the bacteria causing the infection. Rarely, sinusitis is caused by a fungal infection. In these cases, your health care provider will prescribe antifungal medicine. For some cases of chronic sinusitis, surgery is needed. Generally, these are cases in which sinusitis recurs more than 3 times per year, despite other treatments. HOME CARE INSTRUCTIONS  Drink plenty of water. Water helps thin the mucus so your sinuses can drain more easily.  Use a humidifier.  Inhale steam 3-4 times a day (for example, sit in the bathroom with the shower running).  Apply a warm, moist washcloth to your face 3-4 times a day, or as directed by  your health care provider.  Use saline nasal sprays to help moisten and clean your sinuses.  Take medicines only as directed by your health care provider.  If you were prescribed either an antibiotic or antifungal medicine, finish it all even if you start to feel better. SEEK IMMEDIATE MEDICAL CARE IF:  You have increasing pain or severe headaches.  You have nausea, vomiting, or drowsiness.  You have swelling around your face.  You have vision problems.  You have a stiff neck.  You have difficulty breathing.   This information is not intended to replace advice given to you by  your health care provider. Make sure you discuss any questions you have with your health care provider.   Document Released: 11/25/2005 Document Revised: 12/16/2014 Document Reviewed: 12/10/2011 Elsevier Interactive Patient Education [employer]

## 2016-08-07 ENCOUNTER — Emergency Department (HOSPITAL_COMMUNITY)
Admission: EM | Admit: 2016-08-07 | Discharge: 2016-08-07 | Disposition: A | Discharge status: Home / Self Care | Payer: Medicaid Other | Attending: Emergency Medicine | Admitting: Emergency Medicine

## 2016-08-07 ENCOUNTER — Encounter (HOSPITAL_COMMUNITY): Payer: Self-pay | Admitting: *Deleted

## 2016-08-07 DIAGNOSIS — F410 Panic disorder [episodic paroxysmal anxiety] without agoraphobia: Secondary | ICD-10-CM | POA: Diagnosis not present

## 2016-08-07 DIAGNOSIS — R0789 Other chest pain: Secondary | ICD-10-CM | POA: Diagnosis present

## 2016-08-07 DIAGNOSIS — R000 Tachycardia, unspecified: Secondary | ICD-10-CM | POA: Insufficient documentation

## 2016-08-07 HISTORY — DX: Disorder of thyroid, unspecified: E07.9

## 2016-08-07 LAB — I-STAT TROPONIN, ED: Troponin i, poc: 0 ng/mL (ref 0.00–0.08)

## 2016-08-07 LAB — D-DIMER, QUANTITATIVE (NOT AT ARMC)

## 2016-08-07 MED ORDER — LORAZEPAM 1 MG PO TABS: 1.0000 mg | ORAL_TABLET | Freq: Three times a day (TID) | ORAL | 0 refills | Status: DC | PRN

## 2016-08-07 MED ORDER — IBUPROFEN 800 MG PO TABS
800.0000 mg | ORAL_TABLET | Freq: Once | ORAL | Status: AC
  Administered 2016-08-07: 800 mg via ORAL
  Filled 2016-08-07: qty 1

## 2016-08-07 MED ORDER — LORAZEPAM 1 MG PO TABS
1.0000 mg | ORAL_TABLET | Freq: Once | ORAL | Status: AC
  Administered 2016-08-07: 1 mg via ORAL
  Filled 2016-08-07: qty 1

## 2016-08-07 NOTE — ED Provider Notes (Signed)
[facility]-EMERGENCY DEPT Provider Note   CSN: [id number] Arrival date & time: 08/07/16  0015  By signing my name below, I, Freida Busman, attest that this documentation has been prepared under the direction and in the presence of non-physician practitioner, Antony Madura, PA-C. Electronically Signed: Freida Busman, Scribe. 08/07/2016. 1:01 AM.   History   Chief Complaint Chief Complaint  Patient presents with  . Chest Pain    HPI Sheri Brock is a 30 y.o. female.  Sheri Brock is a 30 y.o. female who presents to the Emergency Department complaining of right sided CP which began while watching a movie last night (08/06/16). She describe pressure in her chest. Pt believes she had a panic attack. She reports associated palpitations and tingling in her hands. She was also hyperventilating during this episode.  Pt had a similar episode yesterday and was evaluated by EMS who suggested she was having a panic attack. Pt denies h/o anxiety and panic attacks.  She also denies fever, nausea, and vomiting.  No recent hospitalizations or use of birth control.   Pt is also complaining of a pulsating HA at this time. She reports h/o migraines.     Chest Pain      Past Medical History:  Diagnosis Date  . Thyroid disease     There are no active problems to display for this patient.   History reviewed. No pertinent surgical history.  OB History    No data available       Home Medications    Prior to Admission medications   Medication Sig Start Date End Date Taking? Authorizing Provider  amoxicillin (AMOXIL) 500 MG capsule Take 1 capsule (500 mg total) by mouth 3 (three) times daily. 01/08/16   Mancel Bale, MD  LORazepam (ATIVAN) 1 MG tablet Take 1 tablet (1 mg total) by mouth 3 (three) times daily as needed for anxiety. 08/07/16   Antony Madura, PA-C    Family History No family history on file.  Social History Social History  Substance Use Topics  . Smoking status: Never Smoker    . Smokeless tobacco: Never Used  . Alcohol use Yes     Allergies   Review of patient's allergies indicates no known allergies.   Review of Systems Review of Systems  Cardiovascular: Positive for chest pain.  Ten systems reviewed and are negative for acute change, except as noted in the HPI.    Physical Exam Updated Vital Signs BP 142/97 (BP Location: Left Arm)   Pulse 91   Resp (!) 35   LMP 07/10/2016   SpO2 100%   Physical Exam  Constitutional: She is oriented to person, place, and time. She appears well-developed and well-nourished. No distress.  Nontoxic appearing; in NAD  HENT:  Head: Normocephalic and atraumatic.  Eyes: Conjunctivae and EOM are normal. No scleral icterus.  Neck: Normal range of motion.  Cardiovascular: Regular rhythm and intact distal pulses.   Mild tachycardia  Pulmonary/Chest: Effort normal. No respiratory distress. She has no wheezes. She has no rales.  Respirations even and unlabored. Lungs CTAB.  Musculoskeletal: Normal range of motion.  No peripheral edema  Neurological: She is alert and oriented to person, place, and time.  GCS 15. Patient moving all extremities.  Skin: Skin is warm and dry. No rash noted. She is not diaphoretic. No erythema. No pallor.  Psychiatric: She has a normal mood and affect. Her behavior is normal.  Nursing note and vitals reviewed.    ED Treatments / Results  Labs (  all labs ordered are listed, but only abnormal results are displayed) Labs Reviewed  D-DIMER, QUANTITATIVE (NOT [facility])  Rosezena SensorI-STAT TROPOININ, ED    EKG  EKG Interpretation  Date/Time:  Wednesday August 07 2016 00:27:50 EDT Ventricular Rate:  103 PR Interval:    QRS Duration: 88 QT Interval:  367 QTC Calculation: 481 R Axis:   47 Text Interpretation:  Sinus tachycardia Confirmed by [facility]  MD, APRIL ([id number]) on 08/07/2016 1:57:39 AM       Radiology No results found.  Procedures Procedures (including critical care  time)  Medications Ordered in ED Medications  ibuprofen (ADVIL,MOTRIN) tablet 800 mg (800 mg Oral Given 08/07/16 0109)  LORazepam (ATIVAN) tablet 1 mg (1 mg Oral Given 08/07/16 0109)     Initial Impression / Assessment and Plan / ED Course  I have reviewed the triage vital signs and the nursing notes.  Pertinent labs & imaging results that were available during my care of the patient were reviewed by me and considered in my medical decision making (see chart for details).  Clinical Course    30 year old female percent to the emergency department for evaluation of right-sided chest pressure. This was associated with hyperventilation. Patient had a similar episode yesterday where EMS was called out. They thought she was having a panic attack. Patient was sitting in a movie theater tonight when symptoms began. She did exhibit mild hyperventilation on arrival as well as mild tachycardia. Patient with negative troponin and nonischemic EKG. Her heart score is 0; doubt cardiac etiology. D-dimer WNL; doubt PE.  Symptoms have improved in the emergency department with Ativan. Resting heart rate has improved as well. I do believe the patient's symptoms are secondary to panic attacks. She does report increased stress in her life recently. Plan to discharge with short course of Ativan. Patient advised follow-up with a primary care doctor. Return precautions discussed and provided. Patient discharged in satisfactory condition.   Final Clinical Impressions(s) / ED Diagnoses   Final diagnoses:  Panic attack    I personally performed the services described in this documentation, which was scribed in my presence. The recorded information has been reviewed and is accurate.    New Prescriptions New Prescriptions   LORAZEPAM (ATIVAN) 1 MG TABLET    Take 1 tablet (1 mg total) by mouth 3 (three) times daily as needed for anxiety.     Antony MaduraKelly Megean Fabio, PA-C 08/07/16 [id number]    April Palumbo, MD 08/07/16  [phone]

## 2016-08-07 NOTE — ED Triage Notes (Signed)
Pt presents with right sided chest pain that began while pt was watching a movie.  Pt also reports back pain and left arm pain. Pt's partner observed pt hyperventilating.  Pt also and an episode similar to this yesterday and reports that EMS dx pt with a panic attack. Pt rates chest pain a 7 out of 10.

## 2016-08-09 ENCOUNTER — Encounter (HOSPITAL_COMMUNITY): Payer: Self-pay | Admitting: Emergency Medicine

## 2016-08-09 DIAGNOSIS — F419 Anxiety disorder, unspecified: Secondary | ICD-10-CM | POA: Insufficient documentation

## 2016-08-09 DIAGNOSIS — R0789 Other chest pain: Secondary | ICD-10-CM | POA: Diagnosis not present

## 2016-08-09 DIAGNOSIS — R51 Headache: Secondary | ICD-10-CM | POA: Insufficient documentation

## 2016-08-09 LAB — CBG MONITORING, ED: Glucose-Capillary: 108 mg/dL — ABNORMAL HIGH (ref 65–99)

## 2016-08-09 NOTE — ED Triage Notes (Signed)
Pt. reports central chest tightness with mild SOB and headache onset today . Denies nausea or diaphoresis .

## 2016-08-10 ENCOUNTER — Emergency Department (HOSPITAL_COMMUNITY): Payer: Medicaid Other

## 2016-08-10 ENCOUNTER — Emergency Department (HOSPITAL_COMMUNITY)
Admission: EM | Admit: 2016-08-10 | Discharge: 2016-08-10 | Disposition: A | Discharge status: Home / Self Care | Payer: Medicaid Other | Attending: Emergency Medicine | Admitting: Emergency Medicine

## 2016-08-10 DIAGNOSIS — R519 Headache, unspecified: Secondary | ICD-10-CM

## 2016-08-10 DIAGNOSIS — F419 Anxiety disorder, unspecified: Secondary | ICD-10-CM

## 2016-08-10 DIAGNOSIS — R51 Headache: Secondary | ICD-10-CM

## 2016-08-10 LAB — BASIC METABOLIC PANEL
ANION GAP: 8 (ref 5–15)
BUN: 10 mg/dL (ref 6–20)
CALCIUM: 9.1 mg/dL (ref 8.9–10.3)
CHLORIDE: 107 mmol/L (ref 101–111)
CO2: 23 mmol/L (ref 22–32)
CREATININE: 0.41 mg/dL — AB (ref 0.44–1.00)
GFR calc non Af Amer: >60 mL/min (ref >60)
Glucose, Bld: 115 mg/dL — ABNORMAL HIGH (ref 65–99)
Potassium: 3.5 mmol/L (ref 3.5–5.1)
SODIUM: 138 mmol/L (ref 135–145)

## 2016-08-10 LAB — CBC
HCT: 32.6 % — ABNORMAL LOW (ref 36.0–46.0)
HEMOGLOBIN: 10.5 g/dL — AB (ref 12.0–15.0)
MCH: 24.2 pg — AB (ref 26.0–34.0)
MCHC: 32.2 g/dL (ref 30.0–36.0)
MCV: 75.1 fL — ABNORMAL LOW (ref 78.0–100.0)
PLATELETS: 226 10*3/uL (ref 150–400)
RBC: 4.34 MIL/uL (ref 3.87–5.11)
RDW: 13.6 % (ref 11.5–15.5)
WBC: 10.9 10*3/uL — AB (ref 4.0–10.5)

## 2016-08-10 LAB — I-STAT TROPONIN, ED: TROPONIN I, POC: 0 ng/mL (ref 0.00–0.08)

## 2016-08-10 LAB — T4, FREE: FREE T4: 3.81 ng/dL — AB (ref 0.61–1.12)

## 2016-08-10 MED ORDER — DIPHENHYDRAMINE HCL 50 MG/ML IJ SOLN
25.0000 mg | Freq: Once | INTRAMUSCULAR | Status: AC
  Administered 2016-08-10: 25 mg via INTRAVENOUS
  Filled 2016-08-10: qty 1

## 2016-08-10 MED ORDER — METOCLOPRAMIDE HCL 5 MG/ML IJ SOLN
10.0000 mg | Freq: Once | INTRAMUSCULAR | Status: AC
  Administered 2016-08-10: 10 mg via INTRAVENOUS
  Filled 2016-08-10: qty 2

## 2016-08-10 MED ORDER — SODIUM CHLORIDE 0.9 % IV BOLUS (SEPSIS)
1000.0000 mL | Freq: Once | INTRAVENOUS | Status: AC
  Administered 2016-08-10: 1000 mL via INTRAVENOUS

## 2016-08-10 NOTE — ED Provider Notes (Signed)
[facility]-EMERGENCY DEPT Provider Note   CSN: [id number] Arrival date & time: 08/09/16  2336     History   Chief Complaint Chief Complaint  Patient presents with  . Chest Pain    HPI Cathie BeamsStephanie Cataldo is a 30 y.o. female.  HPI   This is the patient's second visit within the next couple of days for similar symptoms. She has been having anxiety, some chest tightness as well as headache. The headache has been persisting for a week and a half and has been from frontal scalp down to the back of her scalp.  She said she has some photophobia and phonophobia with a mild amount of dizziness.  She says that the chest tightness has persisted and it was evaluated two days ago but that she still has it and it is unchanged. She primary came to the ER for her headache.  She has a PCP that she just got established with and an appt coming up on Sept 26. The appointment to have her thyroid evaluated because she was told it was swollen by a healthcare provider.   She endorses anxiety, weight loss, headaches, chest wall pains.  Past Medical History:  Diagnosis Date  . Thyroid disease     There are no active problems to display for this patient.   History reviewed. No pertinent surgical history.  OB History    No data available       Home Medications    Prior to Admission medications   Medication Sig Start Date End Date Taking? Authorizing Provider  LORazepam (ATIVAN) 1 MG tablet Take 1 tablet (1 mg total) by mouth 3 (three) times daily as needed for anxiety. 08/07/16  Yes Antony MaduraKelly Humes, PA-C    Family History No family history on file.  Social History Social History  Substance Use Topics  . Smoking status: Never Smoker  . Smokeless tobacco: Never Used  . Alcohol use Yes     Allergies   Review of patient's allergies indicates no known allergies.   Review of Systems Review of Systems Review of Systems All other systems negative except as documented in the HPI. All pertinent  positives and negatives as reviewed in the HPI.   Physical Exam Updated Vital Signs BP 125/61   Pulse 91   Temp 98.1 F (36.7 C) (Oral)   Resp (!) 29   LMP 07/10/2016   SpO2 100%   Physical Exam  Constitutional: She appears well-developed and well-nourished.  HENT:  Head: Normocephalic and atraumatic.  Eyes: Conjunctivae are normal. Pupils are equal, round, and reactive to light.  Neck: Trachea normal, normal range of motion and full passive range of motion without pain. Neck supple.  Cardiovascular: Normal rate, regular rhythm and normal pulses.   Pulmonary/Chest: Effort normal and breath sounds normal. Chest wall is not dull to percussion. She exhibits no tenderness, no crepitus, no edema, no deformity and no retraction.  Abdominal: Soft. Normal appearance and bowel sounds are normal.  Musculoskeletal: Normal range of motion.  Neurological: She is alert. She has normal strength.  Cranial nerves grossly intact on exam. Pt alert and oriented x 3 Upper and lower extremity strength is symmetrical and physiologic Normal muscular tone No facial droop Coordination intact, no limb ataxia,No pronator drift  Skin: Skin is warm, dry and intact.  Psychiatric: She has a normal mood and affect. Her speech is normal and behavior is normal. Judgment and thought content normal. Cognition and memory are normal.     ED Treatments /  Results  Labs (all labs ordered are listed, but only abnormal results are displayed) Labs Reviewed  BASIC METABOLIC PANEL - Abnormal; Notable for the following:       Result Value   Glucose, Bld 115 (*)    Creatinine, Ser 0.41 (*)    All other components within normal limits  CBC - Abnormal; Notable for the following:    WBC 10.9 (*)    Hemoglobin 10.5 (*)    HCT 32.6 (*)    MCV 75.1 (*)    MCH 24.2 (*)    All other components within normal limits  CBG MONITORING, ED - Abnormal; Notable for the following:    Glucose-Capillary 108 (*)    All other  components within normal limits  TSH  T4, FREE  T4  T3  I-STAT TROPOININ, ED    EKG  EKG Interpretation None       Radiology Dg Chest 2 View  Result Date: 08/10/2016 CLINICAL DATA:  Acute onset of central chest pain. Initial encounter. EXAM: CHEST  2 VIEW COMPARISON:  Chest radiograph performed 01/08/2016 FINDINGS: The lungs are well-aerated and clear. There is no evidence of focal opacification, pleural effusion or pneumothorax. The heart is normal in size; the mediastinal contour is within normal limits. No acute osseous abnormalities are seen. IMPRESSION: No acute cardiopulmonary process seen. Electronically Signed   By: Roanna Raider M.D.   On: 08/10/2016 00:22   Ct Head Wo Contrast  Result Date: 08/10/2016 CLINICAL DATA:  Headaches for 2 weeks with intermittent blurry vision. EXAM: CT HEAD WITHOUT CONTRAST TECHNIQUE: Contiguous axial images were obtained from the base of the skull through the vertex without intravenous contrast. COMPARISON:  None. FINDINGS: Brain: Ventricles and sulci appear symmetrical. No ventricular dilatation. No mass effect or midline shift. No abnormal extra-axial fluid collections. Gray-white matter junctions are distinct. Basal cisterns are not effaced. No evidence of acute intracranial hemorrhage. Vascular: No hyperdense vessel or unexpected calcification. Skull: No depressed skull fractures. Sinuses/Orbits: No acute finding. Other: None. IMPRESSION: No acute intracranial abnormalities. Electronically Signed   By: Burman Nieves M.D.   On: 08/10/2016 03:17    Procedures Procedures (including critical care time)  Medications Ordered in ED Medications  sodium chloride 0.9 % bolus 1,000 mL (1,000 mLs Intravenous New Bag/Given 08/10/16 0313)  diphenhydrAMINE (BENADRYL) injection 25 mg (25 mg Intravenous Given 08/10/16 0316)  metoCLOPramide (REGLAN) injection 10 mg (10 mg Intravenous Given 08/10/16 0313)     Initial Impression / Assessment and Plan / ED Course   I have reviewed the triage vital signs and the nursing notes.  Pertinent labs & imaging results that were available during my care of the patient were reviewed by me and considered in my medical decision making (see chart for details).  Clinical Course    Patient requesting head CT, she is concerned about having a prolonged headache and says she will end up coming right back without it because her anxiety levels will be so high.   CT ordered of head per patient requests. Her Chest xray, CT head,CBC, BMP and troponin are all unremarkable. Her symptoms and swollen thyroid are somewhat concerning for hyperthyroidism-- I have ordered her thyroid levels to be checked before discharge so that her PCP can have them at her appt on the 26th.   Final Clinical Impressions(s) / ED Diagnoses   Final diagnoses:  Nonintractable headache, unspecified chronicity pattern, unspecified headache type  Anxiety    New Prescriptions New Prescriptions   No medications  on file     Marlon Pel, PA-C 08/10/16 1610    Geoffery Lyons, MD 08/10/16 [phone]

## 2016-08-10 NOTE — ED Notes (Signed)
Patient transported to CT 

## 2016-08-12 LAB — T4: T4 TOTAL: 15.5 ug/dL — AB (ref 4.5–12.0)

## 2016-08-12 LAB — T3: T3 TOTAL: 379 ng/dL — AB (ref 71–180)

## 2016-11-22 ENCOUNTER — Inpatient Hospital Stay (HOSPITAL_COMMUNITY)
Admission: AD | Admit: 2016-11-22 | Discharge: 2016-11-22 | Disposition: A | Discharge status: Home / Self Care | Payer: Medicaid Other | Source: Ambulatory Visit | Attending: Obstetrics and Gynecology | Admitting: Obstetrics and Gynecology

## 2016-11-22 ENCOUNTER — Encounter (HOSPITAL_COMMUNITY): Payer: Self-pay | Admitting: *Deleted

## 2016-11-22 DIAGNOSIS — N812 Incomplete uterovaginal prolapse: Secondary | ICD-10-CM | POA: Insufficient documentation

## 2016-11-22 LAB — URINALYSIS, ROUTINE W REFLEX MICROSCOPIC
BACTERIA UA: NONE SEEN
BILIRUBIN URINE: NEGATIVE
Glucose, UA: NEGATIVE mg/dL
Hgb urine dipstick: NEGATIVE
KETONES UR: NEGATIVE mg/dL
LEUKOCYTES UA: NEGATIVE
Nitrite: NEGATIVE
PROTEIN: 30 mg/dL — AB
Specific Gravity, Urine: 1.017 (ref 1.005–1.030)
pH: 6 (ref 5.0–8.0)

## 2016-11-22 LAB — POCT PREGNANCY, URINE: PREG TEST UR: NEGATIVE

## 2016-11-22 NOTE — MAU Provider Note (Signed)
Chief Complaint:  uterus coming out   First Provider Initiated Contact with Patient 11/22/16 1808       HPI: Sheri Brock is a 30 y.o. G2P1011 who presents to maternity admissions reporting feeling something protruding from vagina, thinks it is her uterus.  Has been like this for 4 months. She reports no vaginal bleeding, vaginal itching/burning, urinary symptoms, h/a, dizziness, n/v, or fever/chills.    Other  This is a new problem. The current episode started more than 1 month ago. The problem occurs intermittently. The problem has been unchanged. Pertinent negatives include no abdominal pain, chills, fever, myalgias, nausea, vomiting or weakness. The symptoms are aggravated by standing and coughing. She has tried nothing for the symptoms.   RN Note: Pt states she feels something coming out of her vagina and after looking it up she thinks it is her uterus  Past Medical History: Past Medical History:  Diagnosis Date  . Thyroid disease     Past obstetric history: OB History  Gravida Para Term Preterm AB Living  2 1 1   1 1   SAB TAB Ectopic Multiple Live Births          1    # Outcome Date GA Lbr Len/2nd Weight Sex Delivery Anes PTL Lv  2 Term 11/18/12     Vag-Spont     1 AB               Past Surgical History: Past Surgical History:  Procedure Laterality Date  . NO PAST SURGERIES      Family History: History reviewed. No pertinent family history.  Social History: Social History  Substance Use Topics  . Smoking status: Never Smoker  . Smokeless tobacco: Never Used  . Alcohol use Yes    Allergies: No Known Allergies  Meds:  Prescriptions Prior to Admission  Medication Sig Dispense Refill Last Dose  . LORazepam (ATIVAN) 1 MG tablet Take 1 tablet (1 mg total) by mouth 3 (three) times daily as needed for anxiety. (Patient not taking: Reported on 11/22/2016) 15 tablet 0 Not Taking at Unknown time    I have reviewed patient's Past Medical Hx, Surgical Hx, Family  Hx, Social Hx, medications and allergies.  ROS:  Review of Systems  Constitutional: Negative for chills and fever.  Gastrointestinal: Negative for abdominal pain, nausea and vomiting.  Musculoskeletal: Negative for myalgias.  Neurological: Negative for weakness.   Other systems negative     Physical Exam  Patient Vitals for the past 24 hrs:  BP Temp Temp src Pulse Resp SpO2  11/22/16 1749 128/62 98.2 F (36.8 C) Oral 99 18 100 %   Constitutional: Well-developed, well-nourished female in no acute distress.  Cardiovascular: normal rate and rhythm, no ectopy audible, S1 & S2 heard, no murmur Respiratory: normal effort, no distress. Lungs CTAB with no wheezes or crackles GI: Abd soft, non-tender.  Nondistended.  No rebound, No guarding.  Bowel Sounds audible  MS: Extremities nontender, no edema, normal ROM Neurologic: Alert and oriented x 4.   Grossly nonfocal. GU: Neg CVAT. Skin:  Warm and Dry Psych:  Affect appropriate.  PELVIC EXAM: Cervix is visible at introitus   Uterus is otherwise mostly well supported. Cervix replaces easily but prolapses back.  Nontender to exam.  No excoriation.   Labs: Results for orders placed or performed during the hospital encounter of 11/22/16 (from the past 24 hour(s))  Urinalysis, Routine w reflex microscopic     Status: Abnormal   Collection Time: 11/22/16  5:57 PM  Result Value Ref Range   Color, Urine YELLOW YELLOW   APPearance CLEAR CLEAR   Specific Gravity, Urine 1.017 1.005 - 1.030   pH 6.0 5.0 - 8.0   Glucose, UA NEGATIVE NEGATIVE mg/dL   Hgb urine dipstick NEGATIVE NEGATIVE   Bilirubin Urine NEGATIVE NEGATIVE   Ketones, ur NEGATIVE NEGATIVE mg/dL   Protein, ur 30 (A) NEGATIVE mg/dL   Nitrite NEGATIVE NEGATIVE   Leukocytes, UA NEGATIVE NEGATIVE   RBC / HPF 0-5 0 - 5 RBC/hpf   WBC, UA 0-5 0 - 5 WBC/hpf   Bacteria, UA NONE SEEN NONE SEEN   Squamous Epithelial / LPF 0-5 (A) NONE SEEN   Mucous PRESENT   Pregnancy, urine POC      Status: None   Collection Time: 11/22/16  5:57 PM  Result Value Ref Range   Preg Test, Ur NEGATIVE NEGATIVE      Imaging:  No results found.  MAU Course/MDM: I have ordered labs as follows:  UA, UPT Imaging ordered: none  Treatments in MAU included none.   Pt stable at time of discharge.  Assessment: 1. Cervix prolapsed into vagina     Plan: Discharge home Recommend avoid excessive straining Discussed this is usually treated with surgery or pessary. Pt may be interested in pessary Will refer for [facility] at [facility]  Follow-up Information    [facility].   Specialty:  Obstetrics and Gynecology Why:  someone will call Contact information: [address] [place] [id number] [phone]          Encouraged to return here or to other Urgent Care/ED if she develops worsening of symptoms, increase in pain, fever, or other concerning symptoms.   Wynelle BourgeoisMarie Rafael Salway CNM, MSN Certified Nurse-Midwife 11/22/2016 6:30 PM

## 2016-11-22 NOTE — Discharge Instructions (Signed)
Pelvic Organ Prolapse Introduction Pelvic organ prolapse is the stretching, bulging, or dropping of pelvic organs into an abnormal position. It happens when the muscles and tissues that surround and support pelvic structures are stretched or weak. Pelvic organ prolapse can involve:  Vagina (vaginal prolapse).  Uterus (uterine prolapse).  Bladder (cystocele).  Rectum (rectocele).  Intestines (enterocele). When organs other than the vagina are involved, they often bulge into the vagina or protrude from the vagina, depending on how severe the prolapse is. What are the causes? Causes of this condition include:  Pregnancy, labor, and childbirth.  Long-lasting (chronic) cough.  Chronic constipation.  Obesity.  Past pelvic surgery.  Aging. During and after menopause, a decreased production of the hormone estrogen can weaken pelvic ligaments and muscles.  Consistently lifting more than 50 lb (23 kg).  Buildup of fluid in the abdomen due to certain diseases and other conditions. What are the signs or symptoms? Symptoms of this condition include:  Loss of bladder control when you cough, sneeze, strain, and exercise (stress incontinence). This may be worse immediately following childbirth, and it may gradually improve over time.  Feeling pressure in your pelvis or vagina. This pressure may increase when you cough or when you are having a bowel movement.  A bulge that protrudes from the opening of your vagina or against your vaginal wall. If your uterus protrudes through the opening of your vagina and rubs against your clothing, you may also experience soreness, ulcers, infection, pain, and bleeding.  Increased effort to have a bowel movement or urinate.  Pain in your low back.  Pain, discomfort, or disinterest in sexual intercourse.  Repeated bladder infections (urinary tract infections).  Difficulty inserting or inability to insert a tampon or applicator. In some people, this  condition does not cause any symptoms. How is this diagnosed? Your health care provider may perform an internal and external vaginal and rectal exam. During the exam, you may be asked to cough and strain while you are lying down, sitting, and standing up. Your health care provider will determine if other tests are required, such as bladder function tests. How is this treated? In most cases, this condition needs to be treated only if it produces symptoms. No treatment is guaranteed to correct the prolapse or relieve the symptoms completely. Treatment may include:  Lifestyle changes, such as:  Avoiding drinking beverages that contain caffeine.  Increasing your intake of high-fiber foods. This can help to decrease constipation and straining during bowel movements.  Emptying your bladder at scheduled times (bladder training therapy). This can help to reduce or avoid urinary incontinence.  Losing weight if you are overweight or obese.  Estrogen. Estrogen may help mild prolapse by increasing the strength and tone of pelvic floor muscles.  Kegel exercises. These may help mild cases of prolapse by strengthening and tightening the muscles of the pelvic floor.  Pessary insertion. A pessary is a soft, flexible device that is placed into your vagina by your health care provider to help support the vaginal walls and keep pelvic organs in place.  Surgery. This is often the only form of treatment for severe prolapse. Different types of surgeries are available. Follow these instructions at home:  Wear a sanitary pad or absorbent product if you have urinary incontinence.  Avoid heavy lifting and straining with exercise and work. Do not hold your breath when you perform mild to moderate lifting and exercise activities. Limit your activities as directed by your health care provider.  Take   medicines only as directed by your health care provider.  Perform Kegel exercises as directed by your health care  provider.  If you have a pessary, take care of it as directed by your health care provider. Contact a health care provider if:  Your symptoms interfere with your daily activities or sex life.  You need medicine to help with the discomfort.  You notice bleeding from the vagina that is not related to your period.  You have a fever.  You have pain or bleeding when you urinate.  You have bleeding when you have a bowel movement.  You lose urine when you have sex.  You have chronic constipation.  You have a pessary that falls out.  You have vaginal discharge that has a bad smell.  You have low abdominal pain or cramping that is unusual for you. This information is not intended to replace advice given to you by your health care provider. Make sure you discuss any questions you have with your health care provider. Document Released: 06/22/2014 Document Revised: 05/02/2016 Document Reviewed: 02/07/2014  2017 Elsevier  

## 2016-11-22 NOTE — MAU Note (Signed)
Pt states she feels something coming out of her vagina and after looking it up she thinks it is her uterus.

## 2016-11-22 NOTE — MAU Note (Signed)
Pt not in lobby when called

## 2016-12-04 ENCOUNTER — Ambulatory Visit (HOSPITAL_COMMUNITY)
Admission: EM | Admit: 2016-12-04 | Discharge: 2016-12-04 | Disposition: A | Discharge status: Home / Self Care | Payer: Medicaid Other

## 2016-12-11 ENCOUNTER — Ambulatory Visit (INDEPENDENT_AMBULATORY_CARE_PROVIDER_SITE_OTHER): Payer: Medicaid Other | Admitting: Obstetrics & Gynecology

## 2016-12-11 ENCOUNTER — Encounter: Payer: Self-pay | Admitting: Obstetrics & Gynecology

## 2016-12-11 ENCOUNTER — Other Ambulatory Visit (HOSPITAL_COMMUNITY)
Admission: RE | Admit: 2016-12-11 | Discharge: 2016-12-11 | Disposition: A | Discharge status: Home / Self Care | Payer: Medicaid Other | Source: Ambulatory Visit | Attending: Obstetrics & Gynecology | Admitting: Obstetrics & Gynecology

## 2016-12-11 VITALS — BP 133/74 | HR 104 | Wt 202.9 lb

## 2016-12-11 DIAGNOSIS — Z113 Encounter for screening for infections with a predominantly sexual mode of transmission: Secondary | ICD-10-CM | POA: Diagnosis present

## 2016-12-11 DIAGNOSIS — N812 Incomplete uterovaginal prolapse: Secondary | ICD-10-CM | POA: Diagnosis not present

## 2016-12-11 DIAGNOSIS — Z01419 Encounter for gynecological examination (general) (routine) without abnormal findings: Secondary | ICD-10-CM

## 2016-12-11 DIAGNOSIS — Z1151 Encounter for screening for human papillomavirus (HPV): Secondary | ICD-10-CM | POA: Diagnosis not present

## 2016-12-11 NOTE — Progress Notes (Signed)
   Subjective:    Patient ID: Sheri BeamsStephanie Brock, female    DOB: [birth year], 31 y.o.   MRN: [id number]  HPI 31 yo S AAP1 here with a 6 week h/o feeling something bulging out of her vagina. She says that it sometimes feels uncomfortable, but not painful. She denies dyspareunia.   Review of Systems     Objective:   Physical Exam [id number] Breathing, conversing, and ambulating normally Cervix with Grade 2 prolapse, normal bimanual exam Pap smear obtained, cervix is long, appears normal       Assessment & Plan:  Preventative care- Pap with cotesting (and cultures per patient request) She declined flu vaccine Cervical prolapse- offered pessary, She is doing Kegel's exercises She will consider pessary and RTC prn

## 2016-12-13 LAB — CYTOLOGY - PAP
CHLAMYDIA, DNA PROBE: NEGATIVE
Diagnosis: NEGATIVE
HPV: NOT DETECTED
NEISSERIA GONORRHEA: NEGATIVE

## 2017-02-17 ENCOUNTER — Encounter (HOSPITAL_COMMUNITY): Payer: Self-pay | Admitting: Emergency Medicine

## 2017-02-17 ENCOUNTER — Emergency Department (HOSPITAL_COMMUNITY)
Admission: EM | Admit: 2017-02-17 | Discharge: 2017-02-17 | Disposition: A | Discharge status: Home / Self Care | Payer: Medicaid Other | Attending: Emergency Medicine | Admitting: Emergency Medicine

## 2017-02-17 DIAGNOSIS — S20152A Superficial foreign body of breast, left breast, initial encounter: Secondary | ICD-10-CM | POA: Insufficient documentation

## 2017-02-17 DIAGNOSIS — W458XXA Other foreign body or object entering through skin, initial encounter: Secondary | ICD-10-CM | POA: Insufficient documentation

## 2017-02-17 DIAGNOSIS — Y929 Unspecified place or not applicable: Secondary | ICD-10-CM | POA: Insufficient documentation

## 2017-02-17 DIAGNOSIS — Y999 Unspecified external cause status: Secondary | ICD-10-CM | POA: Diagnosis not present

## 2017-02-17 DIAGNOSIS — L089 Local infection of the skin and subcutaneous tissue, unspecified: Secondary | ICD-10-CM

## 2017-02-17 DIAGNOSIS — Y939 Activity, unspecified: Secondary | ICD-10-CM | POA: Insufficient documentation

## 2017-02-17 LAB — POC URINE PREG, ED: Preg Test, Ur: NEGATIVE

## 2017-02-17 MED ORDER — SULFAMETHOXAZOLE-TRIMETHOPRIM 800-160 MG PO TABS: 1.0000 | ORAL_TABLET | Freq: Two times a day (BID) | ORAL | 0 refills | Status: AC

## 2017-02-17 MED ORDER — CEPHALEXIN 500 MG PO CAPS: 500.0000 mg | ORAL_CAPSULE | Freq: Four times a day (QID) | ORAL | 0 refills | Status: DC

## 2017-02-17 NOTE — ED Provider Notes (Signed)
[facility]-EMERGENCY DEPT Provider Note   CSN: [id number] Arrival date & time: 02/17/17  1604  By signing my name below, I, Linna Darner, attest that this documentation has been prepared under the direction and in the presence of non-physician practitioner, [facility] M. Damian Leavell, NP. Electronically Signed: Linna Darner, Scribe. 02/17/2017. 5:20 PM.  History   Chief Complaint Chief Complaint  Patient presents with  . Breast Discharge    The history is provided by the patient. No language interpreter was used.     HPI Comments: Sheri Brock is a 31 y.o. female who presents to the Emergency Department complaining of persistent left nipple drainage beginning today. She states she had her nipples pierced in November but did not have any drainage or other problems until today. Patient doubts the possibility of pregnancy. She denies left nipple pain, adenopathy, or any other associated symptoms.  Past Medical History:  Diagnosis Date  . Thyroid disease     Patient Active Problem List   Diagnosis Date Noted  . Second degree uterine prolapse 12/11/2016    Past Surgical History:  Procedure Laterality Date  . NO PAST SURGERIES      OB History    Gravida Para Term Preterm AB Living   2 1 1   1 1    SAB TAB Ectopic Multiple Live Births           1       Home Medications    Prior to Admission medications   Medication Sig Start Date End Date Taking? Authorizing Provider  cephALEXin (KEFLEX) 500 MG capsule Take 1 capsule (500 mg total) by mouth 4 (four) times daily. 02/17/17   Hope Orlene Och, NP  LORazepam (ATIVAN) 1 MG tablet Take 1 tablet (1 mg total) by mouth 3 (three) times daily as needed for anxiety. Patient not taking: Reported on 11/22/2016 08/07/16   Antony Madura, PA-C  sulfamethoxazole-trimethoprim (BACTRIM DS,SEPTRA DS) 800-160 MG tablet Take 1 tablet by mouth 2 (two) times daily. 02/17/17 02/24/17  Hope Orlene Och, NP    Family History No family history on file.  Social  History Social History  Substance Use Topics  . Smoking status: Never Smoker  . Smokeless tobacco: Never Used  . Alcohol use Yes     Allergies   Patient has no known allergies.   Review of Systems Review of Systems  Constitutional: Negative for chills and fever.  Cardiovascular:       Drainage from left nipple.   Musculoskeletal: Negative for myalgias.  Skin: Positive for wound.  Hematological: Negative for adenopathy.    Physical Exam Updated Vital Signs BP 151/78 (BP Location: Left Arm)   Pulse 112   Temp 98.2 F (36.8 C) (Oral)   Resp 18   Ht 5\' 7"  (1.702 m)   Wt 96.6 kg   LMP 02/14/2017 (Exact Date)   SpO2 100%   BMI 33.36 kg/m   Physical Exam  Constitutional: She is oriented to person, place, and time. She appears well-developed and well-nourished. No distress.  HENT:  Head: Normocephalic and atraumatic.  Eyes: EOM are normal.  Neck: Neck supple.  Cardiovascular: Normal rate.   Pulmonary/Chest: Effort normal.    Musculoskeletal: Normal range of motion.  Left nipple: purulent drainage from piercing site.  Neurological: She is alert and oriented to person, place, and time.  Skin: Skin is warm and dry.  Psychiatric: She has a normal mood and affect. Her behavior is normal.  Nursing note and vitals reviewed.   ED Treatments /  Results  Labs (all labs ordered are listed, but only abnormal results are displayed) Labs Reviewed  POC URINE PREG, ED    Radiology No results found.  Procedures Procedures (including critical care time)  DIAGNOSTIC STUDIES: Oxygen Saturation is 100% on RA, normal by my interpretation.    COORDINATION OF CARE: 5:24 PM Discussed treatment plan with pt at bedside and pt agreed to plan.  Medications Ordered in ED Medications - No data to display   Initial Impression / Assessment and Plan / ED Course  I have reviewed the triage vital signs and the nursing notes.  Final Clinical Impressions(s) / ED Diagnoses  31 y.o.  female with yellow drainage from left nipple at piercing site stable for d/c without fever, axillary nodes or tenderness. Return precautions given. Will treat with antibiotics.  Final diagnoses:  Foreign body of breast, superficial, infected, left, initial encounter    New Prescriptions New Prescriptions   CEPHALEXIN (KEFLEX) 500 MG CAPSULE    Take 1 capsule (500 mg total) by mouth 4 (four) times daily.   SULFAMETHOXAZOLE-TRIMETHOPRIM (BACTRIM DS,SEPTRA DS) 800-160 MG TABLET    Take 1 tablet by mouth 2 (two) times daily.   I personally performed the services described in this documentation, which was scribed in my presence. The recorded information has been reviewed and is accurate.    MoodyHope M Neese, NP 02/17/17 1759    Mancel BaleElliott Wentz, MD 02/17/17 2108

## 2017-02-17 NOTE — ED Notes (Signed)
Pt stable, understands discharge instructions, and reasons for return.   

## 2017-02-17 NOTE — ED Notes (Signed)
Pt c/o left breast discharge. Pt states that it is a large amount of white liquid "like milk". Pt is not currently breast feeding. No pain at site noted. See providers assessment.

## 2017-02-17 NOTE — ED Triage Notes (Signed)
Pt st's she got her nipples pierced in Nov,.  St's today she noticed drainage from left nipple.  Pt denies, redness, soreness or pain to area

## 2017-02-17 NOTE — Discharge Instructions (Signed)
Apply warm wet compresses to the area, take the medication as directed. You may take tylenol and ibuprofen as needed for pain. Return if symptoms worsen.

## 2017-03-28 ENCOUNTER — Encounter (HOSPITAL_COMMUNITY): Payer: Self-pay | Admitting: Oncology

## 2017-03-28 DIAGNOSIS — Y92410 Unspecified street and highway as the place of occurrence of the external cause: Secondary | ICD-10-CM | POA: Insufficient documentation

## 2017-03-28 DIAGNOSIS — Y999 Unspecified external cause status: Secondary | ICD-10-CM | POA: Insufficient documentation

## 2017-03-28 DIAGNOSIS — Z5321 Procedure and treatment not carried out due to patient leaving prior to being seen by health care provider: Secondary | ICD-10-CM | POA: Insufficient documentation

## 2017-03-28 DIAGNOSIS — M545 Low back pain: Secondary | ICD-10-CM | POA: Insufficient documentation

## 2017-03-28 DIAGNOSIS — Z79899 Other long term (current) drug therapy: Secondary | ICD-10-CM | POA: Diagnosis not present

## 2017-03-28 DIAGNOSIS — Y939 Activity, unspecified: Secondary | ICD-10-CM | POA: Diagnosis not present

## 2017-03-28 DIAGNOSIS — S3992XA Unspecified injury of lower back, initial encounter: Secondary | ICD-10-CM | POA: Diagnosis present
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
## 2017-03-29 ENCOUNTER — Ambulatory Visit (HOSPITAL_COMMUNITY)
Admission: EM | Admit: 2017-03-29 | Discharge: 2017-03-29 | Disposition: A | Discharge status: Home / Self Care | Payer: Medicaid Other | Attending: Family Medicine | Admitting: Family Medicine

## 2017-03-29 ENCOUNTER — Emergency Department (HOSPITAL_COMMUNITY)
Admission: EM | Admit: 2017-03-29 | Discharge: 2017-03-29 | Disposition: A | Discharge status: Home / Self Care | Payer: Medicaid Other | Attending: Dermatology | Admitting: Dermatology

## 2017-03-29 ENCOUNTER — Encounter (HOSPITAL_COMMUNITY): Payer: Self-pay | Admitting: Emergency Medicine

## 2017-03-29 DIAGNOSIS — S161XXA Strain of muscle, fascia and tendon at neck level, initial encounter: Secondary | ICD-10-CM | POA: Diagnosis not present

## 2017-03-29 DIAGNOSIS — S39012A Strain of muscle, fascia and tendon of lower back, initial encounter: Secondary | ICD-10-CM

## 2017-03-29 MED ORDER — KETOROLAC TROMETHAMINE 60 MG/2ML IM SOLN
INTRAMUSCULAR | Status: AC
  Filled 2017-03-29: qty 2

## 2017-03-29 MED ORDER — NAPROXEN 500 MG PO TABS: 500.0000 mg | ORAL_TABLET | Freq: Two times a day (BID) | ORAL | 0 refills | Status: DC

## 2017-03-29 MED ORDER — KETOROLAC TROMETHAMINE 60 MG/2ML IM SOLN
60.0000 mg | Freq: Once | INTRAMUSCULAR | Status: AC
  Administered 2017-03-29: 60 mg via INTRAMUSCULAR

## 2017-03-29 MED ORDER — CYCLOBENZAPRINE HCL 10 MG PO TABS: 10.0000 mg | ORAL_TABLET | Freq: Two times a day (BID) | ORAL | 0 refills | Status: DC | PRN
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
## 2017-03-29 NOTE — ED Notes (Signed)
Pt called for v/s recheck, no response in from lobby
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
## 2017-06-30 ENCOUNTER — Encounter (HOSPITAL_COMMUNITY): Payer: Self-pay | Admitting: Emergency Medicine

## 2017-06-30 ENCOUNTER — Emergency Department (HOSPITAL_COMMUNITY): Payer: Medicaid Other

## 2017-06-30 ENCOUNTER — Emergency Department (HOSPITAL_COMMUNITY)
Admission: EM | Admit: 2017-06-30 | Discharge: 2017-06-30 | Disposition: A | Discharge status: Home / Self Care | Payer: Medicaid Other | Attending: Emergency Medicine | Admitting: Emergency Medicine

## 2017-06-30 DIAGNOSIS — R0789 Other chest pain: Secondary | ICD-10-CM | POA: Diagnosis present

## 2017-06-30 DIAGNOSIS — E0590 Thyrotoxicosis, unspecified without thyrotoxic crisis or storm: Secondary | ICD-10-CM | POA: Insufficient documentation

## 2017-06-30 DIAGNOSIS — Z79899 Other long term (current) drug therapy: Secondary | ICD-10-CM | POA: Diagnosis not present

## 2017-06-30 LAB — BASIC METABOLIC PANEL
Anion gap: 9 (ref 5–15)
BUN: 11 mg/dL (ref 6–20)
CHLORIDE: 108 mmol/L (ref 101–111)
CO2: 20 mmol/L — ABNORMAL LOW (ref 22–32)
Calcium: 9 mg/dL (ref 8.9–10.3)
Creatinine, Ser: 0.36 mg/dL — ABNORMAL LOW (ref 0.44–1.00)
GFR calc non Af Amer: >60 mL/min (ref >60)
Glucose, Bld: 104 mg/dL — ABNORMAL HIGH (ref 65–99)
POTASSIUM: 3.3 mmol/L — AB (ref 3.5–5.1)
SODIUM: 137 mmol/L (ref 135–145)

## 2017-06-30 LAB — CBC
HEMATOCRIT: 32.9 % — AB (ref 36.0–46.0)
HEMOGLOBIN: 10.7 g/dL — AB (ref 12.0–15.0)
MCH: 23.2 pg — ABNORMAL LOW (ref 26.0–34.0)
MCHC: 32.5 g/dL (ref 30.0–36.0)
MCV: 71.4 fL — AB (ref 78.0–100.0)
Platelets: 235 10*3/uL (ref 150–400)
RBC: 4.61 MIL/uL (ref 3.87–5.11)
RDW: 13.9 % (ref 11.5–15.5)
WBC: 6.8 10*3/uL (ref 4.0–10.5)

## 2017-06-30 LAB — I-STAT TROPONIN, ED: Troponin i, poc: 0 ng/mL (ref 0.00–0.08)

## 2017-06-30 MED ORDER — METHIMAZOLE 10 MG PO TABS
10.0000 mg | ORAL_TABLET | Freq: Once | ORAL | Status: AC
  Administered 2017-06-30: 10 mg via ORAL
  Filled 2017-06-30: qty 1

## 2017-06-30 MED ORDER — POTASSIUM CHLORIDE CRYS ER 20 MEQ PO TBCR
40.0000 meq | EXTENDED_RELEASE_TABLET | Freq: Once | ORAL | Status: AC
  Administered 2017-06-30: 40 meq via ORAL
  Filled 2017-06-30: qty 2

## 2017-06-30 MED ORDER — ATENOLOL 25 MG PO TABS: 12.5000 mg | ORAL_TABLET | Freq: Every day | ORAL | 0 refills | Status: DC

## 2017-06-30 MED ORDER — HYDROXYZINE HCL 25 MG PO TABS: 25.0000 mg | ORAL_TABLET | Freq: Three times a day (TID) | ORAL | 0 refills | Status: DC | PRN

## 2017-06-30 MED ORDER — METHIMAZOLE 10 MG PO TABS: 10.0000 mg | ORAL_TABLET | Freq: Two times a day (BID) | ORAL | 0 refills | Status: DC

## 2017-06-30 MED ORDER — ATENOLOL 12.5 MG HALF TABLET
12.5000 mg | ORAL_TABLET | Freq: Once | ORAL | Status: AC
  Administered 2017-06-30: 12.5 mg via ORAL
  Filled 2017-06-30: qty 1

## 2017-06-30 NOTE — ED Triage Notes (Addendum)
Pt c/o sharp central/left chest pain and pressure, palpitations, SOB, 3 pillow orthopnea onset last night, worse today. No edema, dizziness, blurred vision, arm or neck pain. Hx hyperthyroidism, has not been taking her prescribed medication. Hx of anxiety and panic attacks, states this does not feel like anxiety.

## 2017-06-30 NOTE — ED Provider Notes (Signed)
[facility]-EMERGENCY DEPT Provider Note   CSN: [id number] Arrival date & time: 06/30/17  0715     History   Chief Complaint Chief Complaint  Patient presents with  . Chest Pain    HPI Madisen Ludvigsen is a 31 y.o. female.  Patient c/o mid chest pain onset yesterday at rest. Pain constant, dull to sharp, non radiating, without specific exacerbating or allev factors. No pleuritic pain. No associated sob, nv or diaphoresis. No cough or uri c/o. No fever or chills. Denies recent wt change/loss. +recent stress. Hx panic attack. Denies depression. No leg pain or swelling. No hx dvt or pe.  Pt with hx hyperthyroidism/Graves - saw endocrinology 1-2 months ago, states did not go get refill of her meds for same. Does have f/u next month. No fam hx cad. No cocaine/drug use. Denies heartburn.    The history is provided by the patient.  Chest Pain   Pertinent negatives include no abdominal pain, no back pain, no cough, no fever, no headaches, no shortness of breath and no vomiting.    Past Medical History:  Diagnosis Date  . Thyroid disease     Patient Active Problem List   Diagnosis Date Noted  . Second degree uterine prolapse 12/11/2016    Past Surgical History:  Procedure Laterality Date  . NO PAST SURGERIES      OB History    Gravida Para Term Preterm AB Living   2 1 1   1 1    SAB TAB Ectopic Multiple Live Births           1       Home Medications    Prior to Admission medications   Medication Sig Start Date End Date Taking? Authorizing Provider  cephALEXin (KEFLEX) 500 MG capsule Take 1 capsule (500 mg total) by mouth 4 (four) times daily. 02/17/17   Janne Napoleon, NP  cyclobenzaprine (FLEXERIL) 10 MG tablet Take 1 tablet (10 mg total) by mouth 2 (two) times daily as needed for muscle spasms. 03/29/17   Deatra Canter, FNP  LORazepam (ATIVAN) 1 MG tablet Take 1 tablet (1 mg total) by mouth 3 (three) times daily as needed for anxiety. Patient not taking: Reported on  11/22/2016 08/07/16   Antony Madura, PA-C  naproxen (NAPROSYN) 500 MG tablet Take 1 tablet (500 mg total) by mouth 2 (two) times daily with a meal. 03/29/17   Deatra Canter, FNP    Family History History reviewed. No pertinent family history.  Social History Social History  Substance Use Topics  . Smoking status: Never Smoker  . Smokeless tobacco: Never Used  . Alcohol use Yes     Allergies   Patient has no known allergies.   Review of Systems Review of Systems  Constitutional: Negative for chills and fever.  HENT: Negative for sore throat and trouble swallowing.   Eyes: Negative for visual disturbance.  Respiratory: Negative for cough and shortness of breath.   Cardiovascular: Positive for chest pain.  Gastrointestinal: Negative for abdominal pain and vomiting.  Endocrine: Negative for polyuria.  Genitourinary: Negative for flank pain.  Musculoskeletal: Negative for back pain and neck pain.  Skin: Negative for rash.  Neurological: Negative for headaches.  Hematological: Does not bruise/bleed easily.  Psychiatric/Behavioral: Negative for confusion.     Physical Exam Updated Vital Signs BP (!) 150/94   Pulse (!) 105   Temp 98.3 F (36.8 C) (Oral)   Resp 16   LMP 05/31/2017   SpO2 100%  Physical Exam  Constitutional: She appears well-developed and well-nourished. No distress.  HENT:  Mouth/Throat: Oropharynx is clear and moist.  Eyes: Conjunctivae are normal. No scleral icterus.  Neck: Neck supple. No tracheal deviation present.  Thyroid enlarged, non tender, no nodules/mass.   Cardiovascular: Normal rate, regular rhythm, normal heart sounds and intact distal pulses.  Exam reveals no gallop and no friction rub.   No murmur heard. Pulmonary/Chest: Effort normal and breath sounds normal. No respiratory distress. She exhibits tenderness.  Abdominal: Soft. Normal appearance and bowel sounds are normal. She exhibits no distension. There is no tenderness.    Genitourinary:  Genitourinary Comments: No cva tenderness  Musculoskeletal: She exhibits no edema or tenderness.  Neurological: She is alert.  Skin: Skin is warm and dry. No rash noted.  Psychiatric: She has a normal mood and affect.  Nursing note and vitals reviewed.    ED Treatments / Results  Labs (all labs ordered are listed, but only abnormal results are displayed) Results for orders placed or performed during the hospital encounter of 06/30/17  Basic metabolic panel  Result Value Ref Range   Sodium 137 135 - 145 mmol/L   Potassium 3.3 (L) 3.5 - 5.1 mmol/L   Chloride 108 101 - 111 mmol/L   CO2 20 (L) 22 - 32 mmol/L   Glucose, Bld 104 (H) 65 - 99 mg/dL   BUN 11 6 - 20 mg/dL   Creatinine, Ser 6.96 (L) 0.44 - 1.00 mg/dL   Calcium 9.0 8.9 - 29.5 mg/dL   GFR calc non Af Amer >60 >60 mL/min   GFR calc Af Amer >60 >60 mL/min   Anion gap 9 5 - 15  CBC  Result Value Ref Range   WBC 6.8 4.0 - 10.5 K/uL   RBC 4.61 3.87 - 5.11 MIL/uL   Hemoglobin 10.7 (L) 12.0 - 15.0 g/dL   HCT 28.4 (L) 13.2 - 44.0 %   MCV 71.4 (L) 78.0 - 100.0 fL   MCH 23.2 (L) 26.0 - 34.0 pg   MCHC 32.5 30.0 - 36.0 g/dL   RDW 10.2 72.5 - 36.6 %   Platelets 235 150 - 400 K/uL  I-stat troponin, ED  Result Value Ref Range   Troponin i, poc 0.00 0.00 - 0.08 ng/mL   Comment 3           Dg Chest 2 View  Result Date: 06/30/2017 CLINICAL DATA:  Chest pain . EXAM: CHEST  2 VIEW COMPARISON:  08/09/2016 . FINDINGS: Mediastinum and hilar structures normal. Heart size normal. No pleural effusion or pneumothorax. No acute bony abnormality . IMPRESSION: No acute cardiopulmonary disease. Electronically Signed   By: Maisie Fus  Register   On: 06/30/2017 07:50    EKG  EKG Interpretation  Date/Time:  Monday June 30 2017 07:21:12 EDT Ventricular Rate:  106 PR Interval:    QRS Duration: 93 QT Interval:  361 QTC Calculation: 480 R Axis:   42 Text Interpretation:  Sinus tachycardia Borderline prolonged QT interval  Confirmed by Cathren Laine ([id number]) on 06/30/2017 8:25:01 AM       Radiology Dg Chest 2 View  Result Date: 06/30/2017 CLINICAL DATA:  Chest pain . EXAM: CHEST  2 VIEW COMPARISON:  08/09/2016 . FINDINGS: Mediastinum and hilar structures normal. Heart size normal. No pleural effusion or pneumothorax. No acute bony abnormality . IMPRESSION: No acute cardiopulmonary disease. Electronically Signed   By: Maisie Fus  Register   On: 06/30/2017 07:50    Procedures Procedures (including critical care time)  Medications Ordered in ED Medications  potassium chloride SA (K-DUR,KLOR-CON) CR tablet 40 mEq (not administered)  atenolol (TENORMIN) tablet 12.5 mg (not administered)  methimazole (TAPAZOLE) tablet 10 mg (not administered)     Initial Impression / Assessment and Plan / ED Course  I have reviewed the triage vital signs and the nursing notes.  Pertinent labs & imaging results that were available during my care of the patient were reviewed by me and considered in my medical decision making (see chart for details).  After symptoms constant x 1 day, trop neg.  Pt with chest wall tenderness on exam reproducing symptoms, ?musculoskeletal.   Pt also inquires about tx for anxiety/stress. Will give rx vistaril for home.  Reviewed nursing notes and prior charts for additional history. Reviewed endocrinology notes-  Pt supposed to be taking methimazole 10 mg bid, atenolol 12.5 -- pt has plans to f/u next month.  Pt given dose of meds in ED.  Pt currently appears stable for d/c.     Final Clinical Impressions(s) / ED Diagnoses   Final diagnoses:  None    New Prescriptions New Prescriptions   No medications on file     Cathren LaineSteinl, Macaila Tahir, MD 06/30/17 [phone]

## 2017-06-30 NOTE — ED Notes (Signed)
Unable to collect labs at this time PT in xray 

## 2017-06-30 NOTE — Discharge Instructions (Signed)
It was our pleasure to provide your ER care today - we hope that you feel better.  Take the methimazole and atenolol as prescribed for your thyroid condition.  You may take vistaril as need for anxiety - no driving when taking.  You can take acetaminophen or ibuprofen as need for pain.   Follow up with your thyroid doctor as planned in the coming month.  Return to ER if worse, new symptoms, trouble breathing, fevers, other concern.

## 2017-09-06 DIAGNOSIS — E049 Nontoxic goiter, unspecified: Secondary | ICD-10-CM | POA: Insufficient documentation

## 2018-03-04 ENCOUNTER — Emergency Department (HOSPITAL_COMMUNITY): Payer: Medicaid Other

## 2018-03-04 ENCOUNTER — Emergency Department (HOSPITAL_COMMUNITY)
Admission: EM | Admit: 2018-03-04 | Discharge: 2018-03-04 | Disposition: A | Discharge status: Home / Self Care | Payer: Medicaid Other | Attending: Emergency Medicine | Admitting: Emergency Medicine

## 2018-03-04 ENCOUNTER — Other Ambulatory Visit: Payer: Self-pay

## 2018-03-04 ENCOUNTER — Encounter (HOSPITAL_COMMUNITY): Payer: Self-pay | Admitting: Emergency Medicine

## 2018-03-04 DIAGNOSIS — F419 Anxiety disorder, unspecified: Secondary | ICD-10-CM | POA: Diagnosis present

## 2018-03-04 DIAGNOSIS — F410 Panic disorder [episodic paroxysmal anxiety] without agoraphobia: Secondary | ICD-10-CM | POA: Diagnosis not present

## 2018-03-04 DIAGNOSIS — Z79899 Other long term (current) drug therapy: Secondary | ICD-10-CM | POA: Insufficient documentation

## 2018-03-04 LAB — BASIC METABOLIC PANEL
ANION GAP: 7 (ref 5–15)
BUN: 8 mg/dL (ref 6–20)
CALCIUM: 9 mg/dL (ref 8.9–10.3)
CO2: 23 mmol/L (ref 22–32)
CREATININE: 0.4 mg/dL — AB (ref 0.44–1.00)
Chloride: 107 mmol/L (ref 101–111)
GLUCOSE: 105 mg/dL — AB (ref 65–99)
Potassium: 3.7 mmol/L (ref 3.5–5.1)
Sodium: 137 mmol/L (ref 135–145)

## 2018-03-04 LAB — CBC WITH DIFFERENTIAL/PLATELET
BASOS ABS: 0 10*3/uL (ref 0.0–0.1)
BASOS PCT: 0 %
EOS ABS: 0 10*3/uL (ref 0.0–0.7)
Eosinophils Relative: 0 %
HCT: 35.3 % — ABNORMAL LOW (ref 36.0–46.0)
Hemoglobin: 11.3 g/dL — ABNORMAL LOW (ref 12.0–15.0)
Lymphocytes Relative: 45 %
Lymphs Abs: 3 10*3/uL (ref 0.7–4.0)
MCH: 24.9 pg — ABNORMAL LOW (ref 26.0–34.0)
MCHC: 32 g/dL (ref 30.0–36.0)
MCV: 77.8 fL — ABNORMAL LOW (ref 78.0–100.0)
MONO ABS: 0.4 10*3/uL (ref 0.1–1.0)
MONOS PCT: 6 %
NEUTROS PCT: 49 %
Neutro Abs: 3.3 10*3/uL (ref 1.7–7.7)
Platelets: 248 10*3/uL (ref 150–400)
RBC: 4.54 MIL/uL (ref 3.87–5.11)
RDW: 12.4 % (ref 11.5–15.5)
WBC: 6.7 10*3/uL (ref 4.0–10.5)

## 2018-03-04 LAB — I-STAT TROPONIN, ED: TROPONIN I, POC: 0 ng/mL (ref 0.00–0.08)

## 2018-03-04 LAB — I-STAT BETA HCG BLOOD, ED (MC, WL, AP ONLY)

## 2018-03-04 LAB — TSH: TSH: <0.01 u[IU]/mL — ABNORMAL LOW (ref 0.350–4.500)

## 2018-03-04 MED ORDER — METHIMAZOLE 10 MG PO TABS: 15.0000 mg | ORAL_TABLET | Freq: Three times a day (TID) | ORAL | 0 refills | Status: DC

## 2018-03-04 MED ORDER — METHIMAZOLE 5 MG PO TABS
15.0000 mg | ORAL_TABLET | Freq: Once | ORAL | Status: AC
  Administered 2018-03-04: 15 mg via ORAL
  Filled 2018-03-04: qty 1

## 2018-03-04 MED ORDER — SODIUM CHLORIDE 0.9 % IV BOLUS
1000.0000 mL | Freq: Once | INTRAVENOUS | Status: AC
  Administered 2018-03-04: 1000 mL via INTRAVENOUS

## 2018-03-04 MED ORDER — ATENOLOL 25 MG PO TABS: 25.0000 mg | ORAL_TABLET | Freq: Every day | ORAL | 0 refills | Status: DC

## 2018-03-04 NOTE — ED Provider Notes (Signed)
[facility]-EMERGENCY DEPT Provider Note   CSN: [id number] Arrival date & time: 03/04/18  1500     History   Chief Complaint Chief Complaint  Patient presents with  . Anxiety    HPI Sheri Brock is a 32 y.o. female with a past medical history of Graves' disease, who presents to ED for evaluation of 3 episodes of chest tightness that lasted for about 10 minutes each earlier today.  First 2 episodes happened when she was sitting down filling out job applications at home.  The third and final episode happened when she was in the ambulance.  All episodes resolved with deep breathing.  She states that the symptoms were related to "panic attack" and reports history of similar symptoms in the past.  However, she is concerned because she has not had 3 episodes back to back like this.  She admits that she is not taking her home methimazole for her Luiz BlareGraves' disease in about a month.  She has not seen a PCP or endocrinologist since her hospitalization in September 2018 for thyrotoxicosis.  She denies any shortness of breath, hemoptysis, leg swelling, recent surgeries, recent prolonged travel, fever, URI symptoms, prior MI, DVT or PE, OCP use.  HPI  Past Medical History:  Diagnosis Date  . Thyroid disease     Patient Active Problem List   Diagnosis Date Noted  . Second degree uterine prolapse 12/11/2016    Past Surgical History:  Procedure Laterality Date  . NO PAST SURGERIES       OB History    Gravida  2   Para  1   Term  1   Preterm      AB  1   Living  1     SAB      TAB      Ectopic      Multiple      Live Births  1            Home Medications    Prior to Admission medications   Medication Sig Start Date End Date Taking? Authorizing Provider  Aromatic Inhalants (VICKS VAPOR IN) Inhale 1 application into the lungs daily.   Yes [provider]  Cholecalciferol (VITAMIN D PO) Take 1 tablet by mouth daily.   Yes [provider]  Multiple Vitamin (MULTIVITAMIN WITH MINERALS) TABS tablet Take 1 tablet by mouth daily.   Yes [provider]  Multiple Vitamins-Minerals (CENTRUM ADULTS PO) Take 1 tablet by mouth daily.   Yes [provider]  atenolol (TENORMIN) 25 MG tablet Take 1 tablet (25 mg total) by mouth daily for 15 days. 03/04/18 03/19/18  Erial Fikes, PA-C  cephALEXin (KEFLEX) 500 MG capsule Take 1 capsule (500 mg total) by mouth 4 (four) times daily. Patient not taking: Reported on 03/04/2018 02/17/17   Janne NapoleonNeese, Hope M, NP  cyclobenzaprine (FLEXERIL) 10 MG tablet Take 1 tablet (10 mg total) by mouth 2 (two) times daily as needed for muscle spasms. Patient not taking: Reported on 03/04/2018 03/29/17   Deatra Canterxford, William J, FNP  hydrOXYzine (ATARAX/VISTARIL) 25 MG tablet Take 1 tablet (25 mg total) by mouth every 8 (eight) hours as needed. Patient not taking: Reported on 03/04/2018 06/30/17   Cathren LaineSteinl, Kevin, MD  LORazepam (ATIVAN) 1 MG tablet Take 1 tablet (1 mg total) by mouth 3 (three) times daily as needed for anxiety. Patient not taking: Reported on 03/04/2018 08/07/16   Antony MaduraHumes, Kelly, PA-C  methimazole (TAPAZOLE) 10 MG tablet Take  1.5 tablets (15 mg total) by mouth 3 (three) times daily for 15 days. 03/04/18 03/19/18  Leza Apsey, PA-C  naproxen (NAPROSYN) 500 MG tablet Take 1 tablet (500 mg total) by mouth 2 (two) times daily with a meal. Patient not taking: Reported on 03/04/2018 03/29/17   Deatra Canter, FNP    Family History No family history on file.  Social History Social History   Tobacco Use  . Smoking status: Never Smoker  . Smokeless tobacco: Never Used  Substance Use Topics  . Alcohol use: Yes  . Drug use: No     Allergies   Patient has no known allergies.   Review of Systems Review of Systems  Constitutional: Negative for appetite change, chills and fever.  HENT: Negative for ear pain, rhinorrhea, sneezing and sore throat.   Eyes: Negative for photophobia and  visual disturbance.  Respiratory: Negative for cough, chest tightness, shortness of breath and wheezing.   Cardiovascular: Positive for chest pain and palpitations.  Gastrointestinal: Negative for abdominal pain, blood in stool, constipation, diarrhea, nausea and vomiting.  Genitourinary: Negative for dysuria, hematuria and urgency.  Musculoskeletal: Negative for myalgias.  Skin: Negative for rash.  Neurological: Negative for dizziness, weakness and light-headedness.     Physical Exam Updated Vital Signs BP (!) 151/83   Pulse 94   Temp 98.4 F (36.9 C) (Oral)   Resp 18   LMP 03/03/2018   SpO2 100%   Physical Exam  Constitutional: She appears well-developed and well-nourished. No distress.  Nontoxic appearing and in no acute distress. Speaking in complete sentences without difficulty.  HENT:  Head: Normocephalic and atraumatic.  Nose: Nose normal.  Eyes: Conjunctivae and EOM are normal. Right eye exhibits no discharge. Left eye exhibits no discharge. No scleral icterus.  Neck: Normal range of motion. Neck supple.  Cardiovascular: Normal rate, regular rhythm, normal heart sounds and intact distal pulses. Exam reveals no gallop and no friction rub.  No murmur heard. Pulmonary/Chest: Effort normal and breath sounds normal. No respiratory distress.  No chest tenderness to palpation.  Abdominal: Soft. Bowel sounds are normal. She exhibits no distension. There is no tenderness. There is no guarding.  Musculoskeletal: Normal range of motion. She exhibits no edema or tenderness.  No lower extremity edema, erythema or calf tenderness bilaterally.  Neurological: She is alert. She exhibits normal muscle tone. Coordination normal.  Skin: Skin is warm and dry. No rash noted.  Psychiatric: She has a normal mood and affect.  Nursing note and vitals reviewed.    ED Treatments / Results  Labs (all labs ordered are listed, but only abnormal results are displayed) Labs Reviewed  BASIC  METABOLIC PANEL - Abnormal; Notable for the following components:      Result Value   Glucose, Bld 105 (*)    Creatinine, Ser 0.40 (*)    All other components within normal limits  CBC WITH DIFFERENTIAL/PLATELET - Abnormal; Notable for the following components:   Hemoglobin 11.3 (*)    HCT 35.3 (*)    MCV 77.8 (*)    MCH 24.9 (*)    All other components within normal limits  TSH - Abnormal; Notable for the following components:   TSH <0.010 (*)    All other components within normal limits  T4, FREE  T4  T3  I-STAT BETA HCG BLOOD, ED ([facility], [facility], [facility] ONLY)  I-STAT TROPONIN, ED    EKG EKG Interpretation  Date/Time:  Wednesday March 04 2018 15:15:26 EDT Ventricular Rate:  98  PR Interval:    QRS Duration: 88 QT Interval:  365 QTC Calculation: 466 R Axis:   47 Text Interpretation:  Sinus rhythm LAE, consider biatrial enlargement RSR' in V1 or V2, probably normal variant Borderline T abnormalities, anterior leads similar to prior 7/18 Confirmed by Meridee Score [phone]) on 03/04/2018 6:56:54 PM   Radiology Dg Chest 2 View  Result Date: 03/04/2018 CLINICAL DATA:  Chest tightness EXAM: CHEST - 2 VIEW COMPARISON:  09/06/2017 FINDINGS: The heart size and mediastinal contours are within normal limits. Both lungs are clear. The visualized skeletal structures are unremarkable. IMPRESSION: No active cardiopulmonary disease. Electronically Signed   By: Jasmine Pang M.D.   On: 03/04/2018 20:08    Procedures Procedures (including critical care time)  Medications Ordered in ED Medications  sodium chloride 0.9 % bolus 1,000 mL (1,000 mLs Intravenous New Bag/Given 03/04/18 2133)  methimazole (TAPAZOLE) tablet 15 mg (15 mg Oral Given 03/04/18 2040)     Initial Impression / Assessment and Plan / ED Course  I have reviewed the triage vital signs and the nursing notes.  Pertinent labs & imaging results that were available during my care of the patient were reviewed by me and considered in my  medical decision making (see chart for details).     Patient, with past medical history of Graves' disease, who presents to ED for evaluation of 3 episodes of chest tightness that lasted for about 10 minutes each that occurred earlier today.  She reports the symptoms were related to a panic attack.  She admits that she has not been taking her home methimazole for Graves' disease for about a month.  She denies any current chest pain, shortness of breath, hemoptysis, prior MI, DVT or PE, OCP use.  On physical exam she is overall well-appearing.  She does not have any chest tenderness to palpation and does state that she is currently symptom-free.  She is not tachycardic or tachypneic.  She is afebrile with no recent use of antipyretics.  CBC, BMP, troponin negative.  EKG with tracing similar to prior.  TSH was low however free T4, T4 and T3 are pending at this time.  Chest x-ray negative.  Patient remained symptom-free here in the ED.  She is given a dose of her home methimazole and fluids.  We will contact case management for follow-up and medication assistance at patient states that she has been unable to afford her medications.  She is PERC negative and low risk by heart score. I doubt cardiac or pulmonary etiology, most likely due to anxiety. No signs of thyrotoxicosis. Strict return precautions given.  Portions of this note were generated with Scientist, clinical (histocompatibility and immunogenetics). Dictation errors may occur despite best attempts at proofreading.   Final Clinical Impressions(s) / ED Diagnoses   Final diagnoses:  Panic attack    ED Discharge Orders        Ordered    methimazole (TAPAZOLE) 10 MG tablet  3 times daily     03/04/18 2221    atenolol (TENORMIN) 25 MG tablet  Daily     03/04/18 2221       Dietrich Pates, PA-C 03/04/18 2227    Terrilee Files, MD 03/05/18 1235

## 2018-03-04 NOTE — Discharge Instructions (Signed)
The case manager will contact you to schedule an appointment and for medication assistance.

## 2018-03-04 NOTE — ED Notes (Signed)
PA at bedside.

## 2018-03-04 NOTE — ED Notes (Signed)
No respiratory or acute distress noted alert and oriented x 3 call light in reach pt calm in room.

## 2018-03-04 NOTE — ED Triage Notes (Signed)
Per EMS pt complaint of acute onset of anxiety with chest tightness onset 40 minutes ago while on computer; denies at present.

## 2018-03-05 ENCOUNTER — Telehealth: Payer: Self-pay | Admitting: Emergency Medicine

## 2018-03-05 LAB — T4, FREE: FREE T4: 2.93 ng/dL — AB (ref 0.61–1.12)

## 2018-03-05 NOTE — Telephone Encounter (Signed)
CM consulted for no ins and no pcp with need for follow up and medication assistance.  CM spoke with pt and discussed the [facility] and [facility].  She reports with her current job she will be receiving insurance starting in Oct and use to have Medicaid but she was dropped from their services.  Pt acknowledged understanding of follow up information and how to use the pharmacy and financial counseling at [facility].  No further CM needs noted at this time.

## 2018-03-06 LAB — T4: T4 TOTAL: 14.1 ug/dL — AB (ref 4.5–12.0)

## 2018-03-06 LAB — T3: T3 TOTAL: 312 ng/dL — AB (ref 71–180)

## 2018-03-20 ENCOUNTER — Ambulatory Visit (INDEPENDENT_AMBULATORY_CARE_PROVIDER_SITE_OTHER): Payer: Medicaid Other | Admitting: Family Medicine

## 2018-03-20 ENCOUNTER — Encounter: Payer: Self-pay | Admitting: Family Medicine

## 2018-03-20 VITALS — BP 139/74 | HR 74 | Temp 98.2°F | Resp 16 | Ht 66.0 in | Wt 215.0 lb

## 2018-03-20 DIAGNOSIS — K05219 Aggressive periodontitis, localized, unspecified severity: Secondary | ICD-10-CM

## 2018-03-20 DIAGNOSIS — E663 Overweight: Secondary | ICD-10-CM

## 2018-03-20 DIAGNOSIS — K0889 Other specified disorders of teeth and supporting structures: Secondary | ICD-10-CM

## 2018-03-20 DIAGNOSIS — E0590 Thyrotoxicosis, unspecified without thyrotoxic crisis or storm: Secondary | ICD-10-CM | POA: Diagnosis not present

## 2018-03-20 DIAGNOSIS — Z23 Encounter for immunization: Secondary | ICD-10-CM

## 2018-03-20 DIAGNOSIS — I152 Hypertension secondary to endocrine disorders: Secondary | ICD-10-CM

## 2018-03-20 LAB — POCT URINALYSIS DIPSTICK
BILIRUBIN UA: NEGATIVE
Glucose, UA: NEGATIVE
KETONES UA: NEGATIVE
Leukocytes, UA: NEGATIVE
Nitrite, UA: NEGATIVE
PROTEIN UA: 30
RBC UA: NEGATIVE
Spec Grav, UA: 1.025 (ref 1.010–1.025)
Urobilinogen, UA: 0.2 E.U./dL
pH, UA: 6 (ref 5.0–8.0)

## 2018-03-20 LAB — POCT GLYCOSYLATED HEMOGLOBIN (HGB A1C): Hemoglobin A1C: 5.4

## 2018-03-20 MED ORDER — AMOXICILLIN 500 MG PO CAPS: 500.0000 mg | ORAL_CAPSULE | Freq: Two times a day (BID) | ORAL | 0 refills | Status: AC

## 2018-03-20 MED ORDER — METHIMAZOLE 10 MG PO TABS: 15.0000 mg | ORAL_TABLET | Freq: Three times a day (TID) | ORAL | 5 refills | Status: DC

## 2018-03-20 MED ORDER — ATENOLOL 25 MG PO TABS: 25.0000 mg | ORAL_TABLET | Freq: Every day | ORAL | 5 refills | Status: DC

## 2018-03-20 MED FILL — ATENOLOL 25 MG TABLET: 25 | 30 days supply | Qty: 30 | Fill #0

## 2018-03-20 MED FILL — AMOXICILLIN 500 MG CAPSULE: 500 | 7 days supply | Qty: 14 | Fill #0

## 2018-03-20 MED FILL — methIMAzole 10 MG TABS: 10 | 15 days supply | Qty: 68 | Fill #0

## 2018-03-20 NOTE — Progress Notes (Signed)
Subjective:    Patient ID: Sheri Brock, female    DOB: [birth year], 32 y.o.   MRN: [id number]  HPI  Sheri Brock, a 32 year old female that presents to establish care. She was recently followed by Ladora Daniel, PA, endocrinology, but has not had a primary provider. Patient has a history of hyperthyroidism. She was initially diagnosed some years ago. She was taking tapzole and atenolol consistently until running out of medication 2 weeks ago.  The patient denies heart palpitations, weight loss, depression, diarrhea, constipation, heat/cold intolerance, or changes to hair and skin.  Sheri Brock is complaining of dental pain and left facial swelling for 2 weeks. Dental pain is worsened by eating. She has taking OTC Ibuprofen without sustained relief. Patient has not scheduled dental appointment due to financial constraints.     Past Medical History:  Diagnosis Date  . Thyroid disease    Social History   Socioeconomic History  . Marital status: Single    Spouse name: Not on file  . Number of children: Not on file  . Years of education: Not on file  . Highest education level: Not on file  Occupational History  . Not on file  Social Needs  . Financial resource strain: Not on file  . Food insecurity:    Worry: Not on file    Inability: Not on file  . Transportation needs:    Medical: Not on file    Non-medical: Not on file  Tobacco Use  . Smoking status: Never Smoker  . Smokeless tobacco: Never Used  Substance and Sexual Activity  . Alcohol use: Yes  . Drug use: No  . Sexual activity: Not on file  Lifestyle  . Physical activity:    Days per week: Not on file    Minutes per session: Not on file  . Stress: Not on file  Relationships  . Social connections:    Talks on phone: Not on file    Gets together: Not on file    Attends religious service: Not on file    Active member of club or organization: Not on file    Attends meetings of clubs or organizations: Not on file    Relationship status: Not on file  . Intimate partner violence:    Fear of current or ex partner: Not on file    Emotionally abused: Not on file    Physically abused: Not on file    Forced sexual activity: Not on file  Other Topics Concern  . Not on file  Social History Narrative  . Not on file    There is no immunization history on file for this patient.  Review of Systems  Constitutional: Positive for unexpected weight change.  HENT: Positive for dental problem.   Respiratory: Negative.   Cardiovascular: Negative.   Gastrointestinal: Negative.   Endocrine: Negative for cold intolerance, heat intolerance, polydipsia, polyphagia and polyuria.  Genitourinary: Negative.   Musculoskeletal: Negative.   Skin: Negative.   Allergic/Immunologic: Negative for food allergies.  Neurological: Negative.   Hematological: Negative.   Psychiatric/Behavioral: Negative.        Objective:   Physical Exam  Constitutional: She is oriented to person, place, and time. She appears well-developed and well-nourished.  HENT:  Head: Normocephalic.  Mouth/Throat: Dental caries present.  Gum erythema  Eyes: Pupils are equal, round, and reactive to light.  exophthalmus   Neck: Normal range of motion. Neck supple.  Cardiovascular: Normal rate, regular rhythm, normal heart sounds and intact distal  pulses.  Pulmonary/Chest: Effort normal and breath sounds normal.  Abdominal: Soft. Bowel sounds are normal.  Neurological: She is alert and oriented to person, place, and time.  Skin: Skin is warm and dry.  Psychiatric: She has a normal mood and affect. Her behavior is normal. Judgment and thought content normal.       BP 139/74 (BP Location: Left Arm, Patient Position: Sitting, Cuff Size: Large)   Pulse 74   Temp 98.2 F (36.8 C) (Oral)   Resp 16   Ht 5\' 6"  (1.676 m)   Wt 215 lb (97.5 kg)   LMP 03/03/2018   SpO2 100%   BMI 34.70 kg/m  Assessment & Plan:  1. Hyperthyroidism - Thyroid Panel  With TSH - atenolol (TENORMIN) 25 MG tablet; Take 1 tablet (25 mg total) by mouth daily for 15 days.  Dispense: 30 tablet; Refill: 5 - Ambulatory referral to Endocrinology - POCT urinalysis dipstick - methimazole (TAPAZOLE) 10 MG tablet; Take 1.5 tablets (15 mg total) by mouth 3 (three) times daily for 15 days.  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 Need for Tdap vaccination - Tdap vaccine greater than or equal to 7yo IM  4. Overweight Body mass index is 34.7 kg/m.  Recommend a lowfat, low carbohydrate diet divided over 5-6 small meals, increase water intake to 6-8 glasses, and 150 minutes per week of cardiovascular exercise.    - HgB A1c  5. Pain, dental Recommend Aleve 220 mg every 12 hours as needed.  Apply warm compresses to left face as needed, use interchangeably with cool compresses Given financial assistance forms, will send referral as payer source becomes available  - amoxicillin (AMOXIL) 500 MG capsule; Take 1 capsule (500 mg total) by mouth 2 (two) times daily for 7 days.  Dispense: 14 capsule; Refill: 0  6. Gum abscess - amoxicillin (AMOXIL) 500 MG capsule; Take 1 capsule (500 mg total) by mouth 2 (two) times daily for 7 days.  Dispense: 14 capsule; Refill: 0   RTC: 6 weeks for pap smear  Nolon NationsLachina Moore Hollis  MSN, FNP-C [facility] [address]  [place], [place] [id number] [phone]

## 2018-03-21 LAB — THYROID PANEL WITH TSH
Free Thyroxine Index: 2.6 (ref 1.2–4.9)
T3 UPTAKE RATIO: 26 % (ref 24–39)
T4 TOTAL: 9.9 ug/dL (ref 4.5–12.0)
TSH: <0.006 u[IU]/mL — ABNORMAL LOW (ref 0.450–4.500)

## 2018-03-21 LAB — COMPREHENSIVE METABOLIC PANEL
A/G RATIO: 1.1 — AB (ref 1.2–2.2)
ALT: 12 IU/L (ref 0–32)
AST: 13 IU/L (ref 0–40)
Albumin: 3.9 g/dL (ref 3.5–5.5)
Alkaline Phosphatase: 114 IU/L (ref 39–117)
BUN / CREAT RATIO: 27 — AB (ref 9–23)
BUN: 17 mg/dL (ref 6–20)
Bilirubin Total: <0.2 mg/dL (ref 0.0–1.2)
CALCIUM: 9.6 mg/dL (ref 8.7–10.2)
CO2: 22 mmol/L (ref 20–29)
CREATININE: 0.64 mg/dL (ref 0.57–1.00)
Chloride: 103 mmol/L (ref 96–106)
GFR calc Af Amer: 136 mL/min/{1.73_m2} (ref >59)
GFR, EST NON AFRICAN AMERICAN: 118 mL/min/{1.73_m2} (ref >59)
GLOBULIN, TOTAL: 3.7 g/dL (ref 1.5–4.5)
Glucose: 103 mg/dL — ABNORMAL HIGH (ref 65–99)
POTASSIUM: 4.7 mmol/L (ref 3.5–5.2)
SODIUM: 136 mmol/L (ref 134–144)
TOTAL PROTEIN: 7.6 g/dL (ref 6.0–8.5)

## 2018-03-24 ENCOUNTER — Telehealth: Payer: Self-pay

## 2018-03-24 NOTE — Telephone Encounter (Signed)
Called and spoke with patient. Advised that tsh is decreased and that it is important that she take the medication as prescribed every day. Advised that we are sending a referral to endocrinology and that their office will be calling to scheduled an appointment. Thanks!

## 2018-03-24 NOTE — Telephone Encounter (Signed)
-----   Message from Massie MaroonLachina M Hollis, [place] sent at 03/21/2018  6:13 AM EDT ----- Regarding: lab results Please inform patient that TSH is decreased <0.06. Take tapazole as prescribed. I have also sent a referral to endocrinology for further evaluation. Follow up in office as scheduled.   Nolon NationsLachina Moore Hollis  MSN, FNP-C Patient Care [facility] [address]  [place], [place] [id number] [phone]

## 2018-03-27 DIAGNOSIS — E0590 Thyrotoxicosis, unspecified without thyrotoxic crisis or storm: Secondary | ICD-10-CM | POA: Insufficient documentation

## 2018-03-27 DIAGNOSIS — I152 Hypertension secondary to endocrine disorders: Secondary | ICD-10-CM | POA: Insufficient documentation

## 2018-04-07 ENCOUNTER — Encounter (HOSPITAL_COMMUNITY): Payer: Self-pay | Admitting: Emergency Medicine

## 2018-04-07 ENCOUNTER — Emergency Department (HOSPITAL_COMMUNITY)
Admission: EM | Admit: 2018-04-07 | Discharge: 2018-04-07 | Disposition: A | Discharge status: Home / Self Care | Payer: Medicaid Other | Attending: Emergency Medicine | Admitting: Emergency Medicine

## 2018-04-07 ENCOUNTER — Emergency Department (HOSPITAL_COMMUNITY): Payer: Medicaid Other

## 2018-04-07 DIAGNOSIS — I10 Essential (primary) hypertension: Secondary | ICD-10-CM | POA: Insufficient documentation

## 2018-04-07 DIAGNOSIS — Z79899 Other long term (current) drug therapy: Secondary | ICD-10-CM | POA: Diagnosis not present

## 2018-04-07 DIAGNOSIS — R0789 Other chest pain: Secondary | ICD-10-CM | POA: Diagnosis not present

## 2018-04-07 DIAGNOSIS — E039 Hypothyroidism, unspecified: Secondary | ICD-10-CM | POA: Insufficient documentation

## 2018-04-07 DIAGNOSIS — R079 Chest pain, unspecified: Secondary | ICD-10-CM | POA: Diagnosis present

## 2018-04-07 HISTORY — DX: Anxiety disorder, unspecified: F41.9

## 2018-04-07 LAB — BASIC METABOLIC PANEL
Anion gap: 7 (ref 5–15)
BUN: 6 mg/dL (ref 6–20)
CALCIUM: 8.8 mg/dL — AB (ref 8.9–10.3)
CHLORIDE: 106 mmol/L (ref 101–111)
CO2: 26 mmol/L (ref 22–32)
CREATININE: 0.48 mg/dL (ref 0.44–1.00)
GFR calc Af Amer: >60 mL/min (ref >60)
Glucose, Bld: 123 mg/dL — ABNORMAL HIGH (ref 65–99)
Potassium: 4.6 mmol/L (ref 3.5–5.1)
SODIUM: 139 mmol/L (ref 135–145)

## 2018-04-07 LAB — CBC
HCT: 36.5 % (ref 36.0–46.0)
Hemoglobin: 11.6 g/dL — ABNORMAL LOW (ref 12.0–15.0)
MCH: 24.8 pg — ABNORMAL LOW (ref 26.0–34.0)
MCHC: 31.8 g/dL (ref 30.0–36.0)
MCV: 78.2 fL (ref 78.0–100.0)
PLATELETS: 316 10*3/uL (ref 150–400)
RBC: 4.67 MIL/uL (ref 3.87–5.11)
RDW: 13.7 % (ref 11.5–15.5)
WBC: 7.8 10*3/uL (ref 4.0–10.5)

## 2018-04-07 LAB — I-STAT TROPONIN, ED
TROPONIN I, POC: 0 ng/mL (ref 0.00–0.08)
Troponin i, poc: 0 ng/mL (ref 0.00–0.08)

## 2018-04-07 LAB — I-STAT BETA HCG BLOOD, ED (MC, WL, AP ONLY): I-stat hCG, quantitative: <5 m[IU]/mL (ref <5)

## 2018-04-07 NOTE — ED Triage Notes (Addendum)
Pt states she woke up this morning and starting having CP. Sharp in nature. Pt states her left arm felt numb at the time. Denies N/V. Pt appears anxious; states she has hx of panic attacks

## 2018-04-07 NOTE — Discharge Instructions (Addendum)
Continue taking medications as prescribed. Follow-up with your primary care doctor for further evaluation of your symptoms and for management of your thyroid disease. Return to the emergency room if you develop difficulty breathing, worsening/constant pain, or any new or concerning symptoms.

## 2018-04-07 NOTE — ED Notes (Signed)
Called pt for vitals re-check; no response at 13:52.

## 2018-04-07 NOTE — ED Notes (Signed)
Patient transported to X-ray 

## 2018-04-07 NOTE — ED Provider Notes (Signed)
[facility] EMERGENCY DEPARTMENT Provider Note   CSN: [id number] Arrival date & time: 04/07/18  1128     History   Chief Complaint Chief Complaint  Patient presents with  . Chest Pain    HPI Sheri Brock is a 32 y.o. female presenting for evaluation of chest pain.  Patient states that she woke up this morning with sharp central chest pain.  It improved slightly, but has returned.  She reports no associated shortness of breath this morning without associated diaphoresis, nausea, vomiting.  States that when she woke up, her left arm was numb, but this is improved.  She has a history of similar, NSAIDs related to the position she is sleeping in.  Pain does not worsen with exertion.  She denies recent fevers, chills, breath, cough, or URI symptoms.  She denies cardiac history.  She denies family history of heart problems.  She denies history of diabetes, hypertension, hyperlipidemia, she does not smoke cigarettes.  No recent travel, surgeries, immobilization, trauma, history of cancer, or history of prior DVT/PE.  She is not on OCPs.  She has a history of hypothyroidism for which she takes medication, she takes atenolol and methimazole.  She also has a history of anxiety, states this might be related.  She has been more stressed recently.  HPI  Past Medical History:  Diagnosis Date  . Anxiety   . Thyroid disease     Patient Active Problem List   Diagnosis Date Noted  . Hyperthyroidism 03/27/2018  . 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 Second degree uterine prolapse 12/11/2016    Past Surgical History:  Procedure Laterality Date  . NO PAST SURGERIES       OB History    Gravida  2   Para  1   Term  1   Preterm      AB  1   Living  1     SAB      TAB      Ectopic      Multiple      Live Births  1            Home Medications    Prior to Admission medications   Medication Sig Start Date End Date Taking? Authorizing  Provider  Aromatic Inhalants (VICKS VAPOR IN) Inhale 1 application into the lungs daily.    [provider]  atenolol (TENORMIN) 25 MG tablet Take 1 tablet (25 mg total) by mouth daily for 15 days. 03/20/18 04/04/18  Massie Maroon, FNP  methimazole (TAPAZOLE) 10 MG tablet Take 1.5 tablets (15 mg total) by mouth 3 (three) times daily for 15 days. 03/20/18 04/04/18  Massie Maroon, FNP    Family History History reviewed. No pertinent family history.  Social History Social History   Tobacco Use  . Smoking status: Never Smoker  . Smokeless tobacco: Never Used  Substance Use Topics  . Alcohol use: Yes  . Drug use: No     Allergies   Patient has no known allergies.   Review of Systems Review of Systems  Respiratory: Positive for shortness of breath (resolved).   Cardiovascular: Positive for chest pain.  Neurological: Positive for numbness (of L arm, resolved).  Psychiatric/Behavioral: The patient is nervous/anxious.   All other systems reviewed and are negative.    Physical Exam Updated Vital Signs BP (!) 157/74 (BP Location: Right Arm)   Pulse 60 Comment: VS prior to being taken back to Pod A  Temp 99.2 F (37.3 C) (Oral)   Resp 12   Ht  (1.676 m)   Wt 97.5 kg (215 lb)   LMP 03/23/2018   SpO2 100%   BMI 34.70 kg/m   Physical Exam  Constitutional: She is oriented to person, place, and time. She appears well-developed and well-nourished. No distress.  Sitting comfortably in NAD. Appears slightly anxious, especially as history continues.   HENT:  Head: Normocephalic and atraumatic.  Mouth/Throat: Uvula is midline, oropharynx is clear and moist and mucous membranes are normal.  Eyes: Pupils are equal, round, and reactive to light. Conjunctivae and EOM are normal.  Neck: Normal range of motion. Neck supple.  Cardiovascular: Normal rate, regular rhythm and intact distal pulses.  Pulmonary/Chest: Effort normal and breath sounds normal. No respiratory  distress. She has no wheezes. She exhibits tenderness.  Speaking in full sentences without difficulty. Clear lung sounds. Mild TTP of L sided chest wall anterior and posterior   Abdominal: Soft. She exhibits no distension and no mass. There is no tenderness. There is no guarding.  Musculoskeletal: Normal range of motion.  Radial and pedal pulses equal bilaterally.  No leg pain or swelling.  Neurological: She is alert and oriented to person, place, and time.  Skin: Skin is warm and dry.  Psychiatric: She has a normal mood and affect.  Nursing note and vitals reviewed.    ED Treatments / Results  Labs (all labs ordered are listed, but only abnormal results are displayed) Labs Reviewed  BASIC METABOLIC PANEL - Abnormal; Notable for the following components:      Result Value   Glucose, Bld 123 (*)    Calcium 8.8 (*)    All other components within normal limits  CBC - Abnormal; Notable for the following components:   Hemoglobin 11.6 (*)    MCH 24.8 (*)    All other components within normal limits  I-STAT TROPONIN, ED  I-STAT BETA HCG BLOOD, ED ([facility], [facility], [facility] ONLY)  I-STAT TROPONIN, ED    EKG EKG Interpretation  Date/Time:  Tuesday April 07 2018 11:32:29 EDT Ventricular Rate:  68 PR Interval:  150 QRS Duration: 76 QT Interval:  420 QTC Calculation: 446 R Axis:   71 Text Interpretation:  Normal sinus rhythm with sinus arrhythmia Anterior infarct , age undetermined Abnormal ECG No acute changes No significant change since last tracing Confirmed by Derwood Kaplan [phone]) on 04/07/2018 2:45:12 PM   Radiology Dg Chest 2 View  Result Date: 04/07/2018 CLINICAL DATA:  Onset of left-sided chest pain this morning. History of hypertension and hyperthyroidism. Nonsmoker. EXAM: CHEST - 2 VIEW COMPARISON:  PA and lateral chest x-ray of March 04, 2018 FINDINGS: The lungs are adequately inflated and clear. The heart and mediastinal structures are normal. There is no pleural effusion. The bony  thorax is unremarkable. IMPRESSION: There is no active cardiopulmonary disease. Electronically Signed   By: David  [place] M.D.   On: 04/07/2018 11:58    Procedures Procedures (including critical care time)  Medications Ordered in ED Medications - No data to display   Initial Impression / Assessment and Plan / ED Course  I have reviewed the triage vital signs and the nursing notes.  Pertinent labs & imaging results that were available during my care of the patient were reviewed by me and considered in my medical decision making (see chart for details).     Patient presenting for evaluation of chest pain.  Physical exam reassuring, she appears nontoxic and in  no apparent distress.  Tenderness to palpation with chest wall anterior and posteriorly.  History of similar pain due to anxiety, and pt concerned that this is her heart, "and she cannot leave her 64 year old without a mom".  Labs reassuring, initial troponin negative.  Electrolytes stable.  X-ray reviewed and interpreted by me, shows no pneumonia.  EKG without signs of STEMI.  Heart score of 1, low risk.  PERC negative.  At this time, doubt ACS, PE, infection.  Discussed case with attending, Dr. Rhunette Croft agrees to plan.  Will repeat troponin and plan for discharge if negative.  Repeat troponin negative. Discussed findings with patient.  Discussed follow-up with primary care for evaluation of hyperthyroidism and continued pain.  At this time, patient appears safe for discharge.  Return precautions given.  Patient states she understands and agrees to plan.  Final Clinical Impressions(s) / ED Diagnoses   Final diagnoses:  Atypical chest pain    ED Discharge Orders    None       Alveria Apley, PA-C 04/07/18 1718    Derwood Kaplan, MD 04/08/18 [phone]

## 2018-04-16 MED FILL — methIMAzole 10 MG TABS: 10 | 15 days supply | Qty: 68 | Fill #1

## 2018-04-16 MED FILL — ATENOLOL 25 MG TABLET: 25 | 30 days supply | Qty: 30 | Fill #1

## 2018-04-22 ENCOUNTER — Ambulatory Visit: Payer: Self-pay

## 2018-05-01 ENCOUNTER — Ambulatory Visit (INDEPENDENT_AMBULATORY_CARE_PROVIDER_SITE_OTHER): Payer: Medicaid Other | Admitting: Family Medicine

## 2018-05-01 VITALS — BP 135/89 | HR 67 | Temp 98.0°F | Resp 16 | Ht 66.0 in | Wt 215.0 lb

## 2018-05-01 DIAGNOSIS — F32A Depression, unspecified: Secondary | ICD-10-CM

## 2018-05-01 DIAGNOSIS — E0590 Thyrotoxicosis, unspecified without thyrotoxic crisis or storm: Secondary | ICD-10-CM | POA: Diagnosis not present

## 2018-05-01 DIAGNOSIS — F329 Major depressive disorder, single episode, unspecified: Secondary | ICD-10-CM | POA: Diagnosis not present

## 2018-05-01 DIAGNOSIS — G629 Polyneuropathy, unspecified: Secondary | ICD-10-CM | POA: Diagnosis not present

## 2018-05-01 MED ORDER — GABAPENTIN 100 MG PO CAPS: 100.0000 mg | ORAL_CAPSULE | Freq: Three times a day (TID) | ORAL | 0 refills | Status: DC

## 2018-05-01 MED FILL — GABAPENTIN 100 MG CAPSULE: 100 | 30 days supply | Qty: 90 | Fill #0

## 2018-05-01 NOTE — Patient Instructions (Addendum)
Will start a trial of gabapentin 100 mg three times daily.   Will follow up by phone with any abnormal laboratory results  [facility] [place] for counseling [phone]

## 2018-05-02 LAB — THYROID PANEL WITH TSH
FREE THYROXINE INDEX: 1.7 (ref 1.2–4.9)
T3 UPTAKE RATIO: 22 % — AB (ref 24–39)
T4, Total: 7.8 ug/dL (ref 4.5–12.0)

## 2018-05-05 ENCOUNTER — Telehealth: Payer: Self-pay

## 2018-05-05 NOTE — Progress Notes (Signed)
Subjective:    Patient ID: Sheri Brock, female    DOB: 1986/08/27, 32 y.o.   MRN: [id number]  HPI  Sheri Brock, a 32 year old female that presents for a follow up of hyperthyroidism. She is also requesting completion of FMLA forms for employer.  Sheri Brock was previously followed by Ladora Daniel, PA, endocrinology, but has been lost to follow up. She established care 1 month ago. Patient has a history of hyperthyroidism. She was initially diagnosed some years ago. Sheri Brock has been taking tapazole and atenolol consistently since establishing care.  The patient denies heart palpitations, weight loss, diarrhea, constipation, heat/cold intolerance, or changes to hair and skin. Patient endorses neuropathy to lower extremities.   Sheri Brock is also complaining of depression. Symptoms include hopelessness, anhedonia, and insomnia. She says that symptoms are present on most days.  She denies a history of depression. She also denies suicidal or homicidal ideations.     Past Medical History:  Diagnosis Date  . Anxiety   . Thyroid disease    Social History   Socioeconomic History  . Marital status: Single    Spouse name: Not on file  . Number of children: Not on file  . Years of education: Not on file  . Highest education level: Not on file  Occupational History  . Not on file  Social Needs  . Financial resource strain: Not on file  . Food insecurity:    Worry: Not on file    Inability: Not on file  . Transportation needs:    Medical: Not on file    Non-medical: Not on file  Tobacco Use  . Smoking status: Never Smoker  . Smokeless tobacco: Never Used  Substance and Sexual Activity  . Alcohol use: Yes  . Drug use: No  . Sexual activity: Not on file  Lifestyle  . Physical activity:    Days per week: Not on file    Minutes per session: Not on file  . Stress: Not on file  Relationships  . Social connections:    Talks on phone: Not on file    Gets together: Not on file   Attends religious service: Not on file    Active member of club or organization: Not on file    Attends meetings of clubs or organizations: Not on file    Relationship status: Not on file  . Intimate partner violence:    Fear of current or ex partner: Not on file    Emotionally abused: Not on file    Physically abused: Not on file    Forced sexual activity: Not on file  Other Topics Concern  . Not on file  Social History Narrative  . Not on file   Immunization History  Administered Date(s) Administered  . Tdap 03/20/2018    Review of Systems  Constitutional: Positive for unexpected weight change.  HENT: Positive for dental problem.   Respiratory: Negative.   Cardiovascular: Negative.   Gastrointestinal: Negative.   Endocrine: Negative for cold intolerance, heat intolerance, polydipsia, polyphagia and polyuria.  Genitourinary: Negative.   Musculoskeletal: Negative.   Skin: Negative.   Allergic/Immunologic: Negative for food allergies.  Neurological: Negative.   Hematological: Negative.   Psychiatric/Behavioral: Positive for sleep disturbance.       Depression       Objective:   Physical Exam  Constitutional: She is oriented to person, place, and time. She appears well-developed and well-nourished.  HENT:  Head: Normocephalic.  Eyes: Pupils are equal, round, and  reactive to light.  exophthalmus   Neck: Normal range of motion. Neck supple.  Cardiovascular: Normal rate, regular rhythm, normal heart sounds and intact distal pulses.  Pulmonary/Chest: Effort normal and breath sounds normal.  Abdominal: Soft. Bowel sounds are normal.  Neurological: She is alert and oriented to person, place, and time.  Skin: Skin is warm and dry.  Psychiatric: She has a normal mood and affect. Her behavior is normal. Judgment and thought content normal.       BP 135/89 (BP Location: Left Arm, Patient Position: Sitting, Cuff Size: Normal)   Pulse 67   Temp 98 F (36.7 C) (Oral)   Resp  16   Ht  (1.676 m)   Wt 215 lb (97.5 kg)   LMP 04/27/2018   SpO2 100%   BMI 34.70 kg/m  Assessment & Plan:  1. Hyperthyroidism Follow up with endocrinology as scheduled.  - Thyroid Panel With TSH  2. Depression, unspecified depression type Pt is not interested in starting anti depression medications at this time.    Office Visit from 05/01/2018 in [facility]  PHQ-9 Total Score  10     [facility] for counseling.   3. Neuropathy - gabapentin (NEURONTIN) 100 MG capsule; Take 1 capsule (100 mg total) by mouth 3 (three) times daily.  Dispense: 90 capsule; Refill: 0   RTC: 4 weeks for neuropathy   Nolon Nations  MSN, FNP-C Patient Care [facility] [address] [place], [place] [address] [phone]

## 2018-05-05 NOTE — Telephone Encounter (Signed)
-----   Message from Massie Maroon, [place] sent at 05/05/2018  5:56 AM EDT ----- Regarding: lab results TSH remains decreased, which is consistent with hyperthyroidism. Please schedule first available appointment with Dr. Everardo All, endocrinologist and call patient with appointment time. Advise patient to continue to take prescribed medications consistently.     Nolon Nations  MSN, FNP-C Patient Care [facility] [address] [place], [place] [address] [phone]

## 2018-05-05 NOTE — Telephone Encounter (Signed)
Called and spoke with patient, Sheri Brock that TSH remains decreased. She is scheduled with Dr. Everardo All on 05/13/2018 and was Sheri Brock to keep this appointment. Asked that she continue to take all medications consistently and keep next scheduled appointment with our office. Thanks!

## 2018-05-06 ENCOUNTER — Encounter: Payer: Self-pay | Admitting: Family Medicine

## 2018-05-07 ENCOUNTER — Ambulatory Visit (INDEPENDENT_AMBULATORY_CARE_PROVIDER_SITE_OTHER): Payer: Medicaid Other | Admitting: Family Medicine

## 2018-05-07 ENCOUNTER — Encounter: Payer: Self-pay | Admitting: Family Medicine

## 2018-05-07 VITALS — BP 140/68 | HR 48 | Temp 98.2°F | Resp 16 | Ht 66.0 in | Wt 221.0 lb

## 2018-05-07 DIAGNOSIS — G8929 Other chronic pain: Secondary | ICD-10-CM | POA: Diagnosis not present

## 2018-05-07 DIAGNOSIS — K047 Periapical abscess without sinus: Secondary | ICD-10-CM

## 2018-05-07 DIAGNOSIS — K089 Disorder of teeth and supporting structures, unspecified: Secondary | ICD-10-CM

## 2018-05-07 MED ORDER — SACCHAROMYCES BOULARDII 250 MG PO CAPS: 250.0000 mg | ORAL_CAPSULE | Freq: Two times a day (BID) | ORAL | 0 refills | Status: DC

## 2018-05-07 MED ORDER — CLINDAMYCIN HCL 150 MG PO CAPS: 150.0000 mg | ORAL_CAPSULE | Freq: Three times a day (TID) | ORAL | 0 refills | Status: DC

## 2018-05-07 MED ORDER — IBUPROFEN 600 MG PO TABS: 600.0000 mg | ORAL_TABLET | Freq: Three times a day (TID) | ORAL | 0 refills | Status: DC | PRN

## 2018-05-07 MED FILL — methIMAzole 10 MG TABS: 10 | 15 days supply | Qty: 68 | Fill #2

## 2018-05-07 MED FILL — IBUPROFEN 600 MG TABLET: 600 | 10 days supply | Qty: 30 | Fill #0

## 2018-05-07 MED FILL — CLINDAMYCIN HCL 150 MG CAPS: 150 | 7 days supply | Qty: 21 | Fill #0

## 2018-05-07 NOTE — Progress Notes (Signed)
History of Present Illness  Sheri Brock, a 32 year old female with a history of hypothyroidism presents complaining of dental pain and right facial swelling.  Patient complaining of dental abscess over the past several months.  She has been evaluated in office and in the emergency department for this problem.  Patient has not establish care with a dentist since relocating to this area.  Current pain intensity is 2-3/10.  Patient last had over-the-counter ibuprofen on last night around 10 PM with moderate relief.  She denies headache, chest pain, dizziness, nausea, vomiting, or diarrhea.  Pain is aggravated by hot/cold food items.  Past Medical History:  Diagnosis Date  . Anxiety   . Thyroid disease    No family history on file. Scheduled Meds: Continuous Infusions: PRN Meds:  No Known Allergies Social History   Socioeconomic History  . Marital status: Single    Spouse name: Not on file  . Number of children: Not on file  . Years of education: Not on file  . Highest education level: Not on file  Occupational History  . Not on file  Social Needs  . Financial resource strain: Not on file  . Food insecurity:    Worry: Not on file    Inability: Not on file  . Transportation needs:    Medical: Not on file    Non-medical: Not on file  Tobacco Use  . Smoking status: Never Smoker  . Smokeless tobacco: Never Used  Substance and Sexual Activity  . Alcohol use: Yes  . Drug use: No  . Sexual activity: Not on file  Lifestyle  . Physical activity:    Days per week: Not on file    Minutes per session: Not on file  . Stress: Not on file  Relationships  . Social connections:    Talks on phone: Not on file    Gets together: Not on file    Attends religious service: Not on file    Active member of club or organization: Not on file    Attends meetings of clubs or organizations: Not on file    Relationship status: Not on file  . Intimate partner violence:    Fear of current or ex  partner: Not on file    Emotionally abused: Not on file    Physically abused: Not on file    Forced sexual activity: Not on file  Other Topics Concern  . Not on file  Social History Narrative  . Not on file  Review of Systems  Constitutional: Negative.   HENT:       Right facial swelling Dental pain   Eyes: Negative.   Cardiovascular: Negative.   Genitourinary: Negative.   Musculoskeletal: Negative.   Skin: Negative.   Neurological: Negative.   Psychiatric/Behavioral: Negative.   Physical Exam  Constitutional: She is oriented to person, place, and time.  HENT:  Head: Normocephalic and atraumatic.  Mouth/Throat: Dental abscesses (right upper) and dental caries present.  Right facial swelling  Eyes: Pupils are equal, round, and reactive to light.  Neck: Normal range of motion.  Cardiovascular: Normal rate, regular rhythm and normal heart sounds.  Pulmonary/Chest: Effort normal and breath sounds normal.  Abdominal: Soft. Bowel sounds are normal.  Neurological: She is alert and oriented to person, place, and time.  Skin: Skin is warm and dry.  Psychiatric: She has a normal mood and affect. Her behavior is normal. Judgment and thought content normal.     1. Dental abscess - clindamycin (CLEOCIN) 150  MG capsule; Take 1 capsule (150 mg total) by mouth 3 (three) times daily.  Dispense: 21 capsule; Refill: 0 - ibuprofen (ADVIL,MOTRIN) 600 MG tablet; Take 1 tablet (600 mg total) by mouth every 8 (eight) hours as needed.  Dispense: 30 tablet; Refill: 0 - saccharomyces boulardii (FLORASTOR) 250 MG capsule; Take 1 capsule (250 mg total) by mouth 2 (two) times daily.  Dispense: 30 capsule; Refill: 0  2. Chronic dental pain - clindamycin (CLEOCIN) 150 MG capsule; Take 1 capsule (150 mg total) by mouth 3 (three) times daily.  Dispense: 21 capsule; Refill: 0 - ibuprofen (ADVIL,MOTRIN) 600 MG tablet; Take 1 tablet (600 mg total) by mouth every 8 (eight) hours as needed.  Dispense: 30 tablet;  Refill: 0   RTC; As previously prescribed   Nolon Nations  MSN, FNP-C Patient Care [facility] [address] [place], [place] [address] [phone]

## 2018-05-07 NOTE — Patient Instructions (Signed)
For dental abscess, located on the right upper gum will start a trial of antibiotics.  Clindamycin 150 mg 3 times per day for 7 days.  Also, will add probiotic to restore normal flora to gut.  Florastor every 12 hours.  For mild to moderate pain associated with dental abscess ibuprofen 600 mg every 8 hours with food. General dentistry, Dr. Wayland Denis, [phone]

## 2018-05-11 MED FILL — ATENOLOL 25 MG TABLET: 25 | 30 days supply | Qty: 30 | Fill #2

## 2018-05-13 ENCOUNTER — Ambulatory Visit (INDEPENDENT_AMBULATORY_CARE_PROVIDER_SITE_OTHER): Payer: Medicaid Other | Admitting: Endocrinology

## 2018-05-13 ENCOUNTER — Encounter: Payer: Self-pay | Admitting: Endocrinology

## 2018-05-13 VITALS — BP 118/74 | HR 62 | Wt 217.6 lb

## 2018-05-13 DIAGNOSIS — E0590 Thyrotoxicosis, unspecified without thyrotoxic crisis or storm: Secondary | ICD-10-CM | POA: Diagnosis not present

## 2018-05-13 NOTE — Progress Notes (Signed)
Subjective:    Patient ID: Sheri BeamsStephanie Brock, female    DOB: 10-11-1986, 32 y.o.   MRN: [id number]  HPI Pt is referred by Julianne HandlerLachina Hollis, NP, for hyperthyroidism.  Pt reports she was dx'ed with hyperthyroidism in 2017.  she has intermittently been on tapazole since 2018.  she has never had XRT to the anterior neck, or thyroid surgery.  she has never had dedicated thyroid imaging.  she does not consume kelp or any other non-prescribed thyroid medication.  she has never been on amiodarone.  She reports moderate fatigue, and assoc insomnia.   Past Medical History:  Diagnosis Date  . Anxiety   . Thyroid disease     Past Surgical History:  Procedure Laterality Date  . NO PAST SURGERIES      Social History   Socioeconomic History  . Marital status: Single    Spouse name: Not on file  . Number of children: Not on file  . Years of education: Not on file  . Highest education level: Not on file  Occupational History  . Not on file  Social Needs  . Financial resource strain: Not on file  . Food insecurity:    Worry: Not on file    Inability: Not on file  . Transportation needs:    Medical: Not on file    Non-medical: Not on file  Tobacco Use  . Smoking status: Never Smoker  . Smokeless tobacco: Never Used  Substance and Sexual Activity  . Alcohol use: Yes  . Drug use: No  . Sexual activity: Not on file  Lifestyle  . Physical activity:    Days per week: Not on file    Minutes per session: Not on file  . Stress: Not on file  Relationships  . Social connections:    Talks on phone: Not on file    Gets together: Not on file    Attends religious service: Not on file    Active member of club or organization: Not on file    Attends meetings of clubs or organizations: Not on file    Relationship status: Not on file  . Intimate partner violence:    Fear of current or ex partner: Not on file    Emotionally abused: Not on file    Physically abused: Not on file    Forced sexual  activity: Not on file  Other Topics Concern  . Not on file  Social History Narrative  . Not on file    Current Outpatient Medications on File Prior to Visit  Medication Sig Dispense Refill  . Aromatic Inhalants (VICKS VAPOR IN) Inhale 1 application into the lungs daily.    Marland Kitchen. atenolol (TENORMIN) 25 MG tablet Take 1 tablet (25 mg total) by mouth daily for 15 days. 30 tablet 5  . clindamycin (CLEOCIN) 150 MG capsule Take 1 capsule (150 mg total) by mouth 3 (three) times daily. 21 capsule 0  . gabapentin (NEURONTIN) 100 MG capsule Take 1 capsule (100 mg total) by mouth 3 (three) times daily. 90 capsule 0  . ibuprofen (ADVIL,MOTRIN) 600 MG tablet Take 1 tablet (600 mg total) by mouth every 8 (eight) hours as needed. 30 tablet 0   No current facility-administered medications on file prior to visit.     No Known Allergies  Family History  Problem Relation Age of Onset  . Thyroid disease Neg Hx     BP 118/74   Pulse 62   Wt 217 lb 9.6 oz (98.7 kg)  LMP 04/27/2018   SpO2 99%   BMI 35.12 kg/m   Review of Systems denies weight loss, hoarseness, visual loss, sob, diarrhea, edema, excessive diaphoresis, easy bruising, and rhinorrhea.  She has myalgias, palpitations, nocturia, tremor, anxiety, heat intolerance, and intermitt headache.       Objective:   Physical Exam VS: see vs page GEN: no distress HEAD: head: no deformity eyes: no periorbital swelling; moderate bilat proptosis.   external nose and ears are normal mouth: no lesion seen NECK: supple, thyroid is 5 times normal size.  No palpable nodule CHEST WALL: no deformity LUNGS: clear to auscultation CV: reg rate and rhythm, no murmur ABD: abdomen is soft, nontender.  no hepatosplenomegaly.  not distended.  no hernia MUSCULOSKELETAL: muscle bulk and strength are grossly normal.  no obvious joint swelling.  gait is normal and steady EXTEMITIES: no deformity.  no leg edema PULSES: no carotid bruit NEURO:  cn 2-12 grossly  intact.   readily moves all 4's.  sensation is intact to touch on all 4's.  No tremor SKIN:  Normal texture and temperature.  No rash or suspicious lesion is visible.  Not diaphoretic NODES:  None palpable at the neck PSYCH: alert, well-oriented.  Does not appear anxious nor depressed.     Lab Results  Component Value Date   TSH <0.006 (L) 05/01/2018   T3TOTAL 312 (H) 03/04/2018   T4TOTAL 7.8 05/01/2018   CT (2016): Mild diffuse enlargement of thyroid gland without dominant nodule.   I have reviewed outside records, and summarized: Pt was noted to have abnormal TFT, and referred here.  She was followed at [facility], but was then lost to f/u.     Assessment & Plan:  Grave's DZ, new Hyperthyroidism:  We discussed rx options.  She chooses RAI.   Patient Instructions  Please stop the methimazole pill. Please continue the same atenolol. let's check a thyroid "scan" (a special, but easy and painless type of thyroid x ray).  It works like this: you go to the x-ray department of the hospital to swallow a pill, which contains a miniscule amount of radiation.  You will not notice any symptoms from this.  You will go back to the x-ray department the next day, to lie down in front of a camera.  The results of this will be sent to me.   Based on the results, i hope to order for you a treatment pill of radioactive iodine.  Although it is a larger amount of radiation, you will again notice no symptoms from this.  The pill is gone from your body in a few days (during which you should stay away from other people), but takes several months to work.  Therefore, please return here approximately 5-10 days after the treatment.  This treatment has been available for many years, and the only known side-effect is an underactive thyroid.  It is possible that i would eventually prescribe for you a thyroid hormone pill, which is very inexpensive.  You don't have to worry about side-effects of this thyroid hormone pill,  because it is the same molecule your thyroid makes.

## 2018-05-13 NOTE — Patient Instructions (Addendum)
Please stop the methimazole pill. Please continue the same atenolol. let's check a thyroid "scan" (a special, but easy and painless type of thyroid x ray).  It works like this: you go to the x-ray department of the hospital to swallow a pill, which contains a miniscule amount of radiation.  You will not notice any symptoms from this.  You will go back to the x-ray department the next day, to lie down in front of a camera.  The results of this will be sent to me.   Based on the results, i hope to order for you a treatment pill of radioactive iodine.  Although it is a larger amount of radiation, you will again notice no symptoms from this.  The pill is gone from your body in a few days (during which you should stay away from other people), but takes several months to work.  Therefore, please return here approximately 5-10 days after the treatment.  This treatment has been available for many years, and the only known side-effect is an underactive thyroid.  It is possible that i would eventually prescribe for you a thyroid hormone pill, which is very inexpensive.  You don't have to worry about side-effects of this thyroid hormone pill, because it is the same molecule your thyroid makes.

## 2018-05-16 ENCOUNTER — Ambulatory Visit (HOSPITAL_COMMUNITY)
Admission: EM | Admit: 2018-05-16 | Discharge: 2018-05-16 | Disposition: A | Discharge status: Home / Self Care | Payer: Medicaid Other

## 2018-05-16 ENCOUNTER — Encounter (HOSPITAL_COMMUNITY): Payer: Self-pay

## 2018-05-16 DIAGNOSIS — F411 Generalized anxiety disorder: Secondary | ICD-10-CM | POA: Diagnosis not present

## 2018-05-16 NOTE — Discharge Instructions (Addendum)
Pleasure meeting you today.  As discussed, importantly follow-up with your primary care provider and Dr Everardo AllEllison for your hyperthyroidism.   I am including a resource which has phone numbers, locations of places in which you can call to to begin counseling.  I think this would be a very important, valuable step for you to take.

## 2018-05-16 NOTE — ED Provider Notes (Signed)
[facility]    CSN: [id number] Arrival date & time: 05/16/18  1156     History   Chief Complaint No chief complaint on file.   HPI Sheri Brock is a 32 y.o. female.   Chief complaint anxiety attack of yesterday. Anxiety attacks for past 2 years.  Partner here is well.Worried about radiation. Not scheduled yet. Couple of panic attacks back to back yesterday. Works at [employer] and sent home. Supposed to work today. Talking to friend and had headset on, when panic attack started.  Told she needed to be seen by work which is why she is here. Would like to rest today.  Feels chest tightness, cry when panic attack occurs. Face turns red, and then afterwards, has 'tension headache'.  Notes not worse headache of her life, improved today.  Anxiety comes on fast and then resolves with breathing, calming down.   Denies exertional chest pain or pressure, numbness or tingling radiating to left arm or jaw, palpitations, dizziness, or shortness of breath.   She follows with endocrinologist, Dr. Everardo AllEllison, seen 05/13/18. Reviewed note.  Advised to stop the methimazole pill,  continue the same atenolol.  Non smoker. No h/o smoking. No h/o HTN No depression; no si/hi.   Not taking gabapentin.   PCP: [facility]     [facility]; recently started on gabapentin for leg pain 05/01/18 Panic attack 02/2018 hospitalization 08/2017 for thyrotoxicosis  Past Medical History:  Diagnosis Date  . Anxiety   . Thyroid disease     Patient Active Problem List   Diagnosis Date Noted  . Hyperthyroidism 03/27/2018  . 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 Second degree uterine prolapse 12/11/2016    Past Surgical History:  Procedure Laterality Date  . NO PAST SURGERIES      OB History    Gravida  2   Para  1   Term  1   Preterm      AB  1   Living  1     SAB      TAB      Ectopic      Multiple      Live Births    1            Home Medications    Prior to Admission medications   Medication Sig Start Date End Date Taking? Authorizing Provider  Aromatic Inhalants (VICKS VAPOR IN) Inhale 1 application into the lungs daily.    [provider]  atenolol (TENORMIN) 25 MG tablet Take 1 tablet (25 mg total) by mouth daily for 15 days. 03/20/18 05/01/18  Massie MaroonHollis, Lachina M, FNP  clindamycin (CLEOCIN) 150 MG capsule Take 1 capsule (150 mg total) by mouth 3 (three) times daily. 05/07/18   Massie MaroonHollis, Lachina M, FNP  gabapentin (NEURONTIN) 100 MG capsule Take 1 capsule (100 mg total) by mouth 3 (three) times daily. 05/01/18   Massie MaroonHollis, Lachina M, FNP  ibuprofen (ADVIL,MOTRIN) 600 MG tablet Take 1 tablet (600 mg total) by mouth every 8 (eight) hours as needed. 05/07/18   Massie MaroonHollis, Lachina M, FNP    Family History Family History  Problem Relation Age of Onset  . Thyroid disease Neg Hx     Social History Social History   Tobacco Use  . Smoking status: Never Smoker  . Smokeless tobacco: Never Used  Substance Use Topics  . Alcohol use: Yes  . Drug use: No     Allergies  Patient has no known allergies.   Review of Systems Review of Systems  Constitutional: Negative for chills and fever.  Respiratory: Positive for chest tightness (resolved). Negative for cough, shortness of breath and wheezing.   Cardiovascular: Negative for chest pain and palpitations.  Gastrointestinal: Negative for nausea and vomiting.  Neurological: Negative for headaches (resolved).  Psychiatric/Behavioral: Negative for suicidal ideas. The patient is nervous/anxious.      Physical Exam Triage Vital Signs ED Triage Vitals  Enc Vitals Group     BP      Pulse      Resp      Temp      Temp src      SpO2      Weight      Height      Head Circumference      Peak Flow      Pain Score      Pain Loc      Pain Edu?      Excl. in GC?    No data found.  Updated Vital Signs BP (!) 153/67 (BP Location: Right Arm)  Comment: reported BP to NP Rennie Plowman  Pulse 62   Temp 98.4 F (36.9 C) (Oral)   Resp 16   LMP 04/27/2018   SpO2 100%   Visual Acuity Right Eye Distance:   Left Eye Distance:   Bilateral Distance:    Right Eye Near:   Left Eye Near:    Bilateral Near:     Physical Exam  Constitutional: She appears well-developed and well-nourished.  Eyes: Conjunctivae are normal.  Moderate bilateral proptosis  Neck: Thyromegaly present.  Diffusely enlarged thyroid  Cardiovascular: Normal rate, regular rhythm, normal heart sounds and normal pulses.  Pulmonary/Chest: Effort normal and breath sounds normal. She has no wheezes. She has no rhonchi. She has no rales.  Neurological: She is alert.  Skin: Skin is warm and dry.  Psychiatric: She has a normal mood and affect. Her speech is normal and behavior is normal. Thought content normal.  Vitals reviewed.    UC Treatments / Results  Labs (all labs ordered are listed, but only abnormal results are displayed) Labs Reviewed - No data to display  EKG None  Radiology No results found.  Procedures Procedures (including critical care time)  Medications Ordered in UC Medications - No data to display  Initial Impression / Assessment and Plan / UC Course  I have reviewed the triage vital signs and the nursing notes.  Pertinent labs & imaging results that were available during my care of the patient were reviewed by me and considered in my medical decision making (see chart for details).      Final Clinical Impressions(s) / UC Diagnoses   Final diagnoses:  GAD (generalized anxiety disorder)   Generalized anxiety disorder, chronic. past 2 years.  H/o hyperthyroidism. Symptoms began prior to medication changes as by endocrinologist this past week and symptoms largely resolved at time of visit today. She would like work note to stay home and rest today, which I thought reasonable.   Understandably so, patient is quite anxious  regarding upcoming radiation therapy.  Patient is well-appearing today.  No acute respiratory distress.  Blood pressure improved after patient rested in exam room, 143/53.    Long discussion with patient and partner today regarding treatment options including pharmacologic , as well as counseling.  Patient declines any medication therapy at this time. She  Is interested in counseling.  Resource has been provided her  today. Return precautions given    Discharge Instructions     Pleasure meeting you today.  As discussed, importantly follow-up with your primary care provider and Dr Everardo All for your hyperthyroidism.   I am including a resource which has phone numbers, locations of places in which you can call to to begin counseling.  I think this would be a very important, valuable step for you to take.      ED Prescriptions    None     Controlled Substance Prescriptions [place] Controlled Substance Registry consulted? Not Applicable   Allegra Grana, FNP 05/16/18 1306

## 2018-05-16 NOTE — ED Triage Notes (Signed)
Pt presents with complaints of having multiple panic attacks yesterday and she feels like she is going to have another one.

## 2018-05-26 ENCOUNTER — Telehealth: Payer: Self-pay

## 2018-05-26 NOTE — Telephone Encounter (Signed)
Patent called today stating she was supposed to get RAI and has not even been called for an appointment. Patient would like a call back to discuss

## 2018-05-26 NOTE — Telephone Encounter (Signed)
I really still am unsure who to send these to?

## 2018-05-27 NOTE — Telephone Encounter (Signed)
I called patient & asked her to hold off on medication for now until approval from Medicaid comes through to get patient scheduled for RAI.

## 2018-05-27 NOTE — Telephone Encounter (Signed)
Just keep sending them to me for now  Can you please call this patient and let her know we are awaiting Medicaid Berkley Harveyauth

## 2018-05-27 NOTE — Telephone Encounter (Signed)
I called patient & let her know update that we are waiting on Medicaid. Patient also asked if in the mean time she should start back taking methimazole? Please advise?

## 2018-05-27 NOTE — Telephone Encounter (Signed)
Please hold off on any medication for now.  [facility]?

## 2018-05-28 ENCOUNTER — Encounter (HOSPITAL_COMMUNITY): Payer: Self-pay | Admitting: Emergency Medicine

## 2018-05-28 ENCOUNTER — Encounter: Payer: Self-pay | Admitting: Family Medicine

## 2018-05-28 ENCOUNTER — Emergency Department (HOSPITAL_COMMUNITY)
Admission: EM | Admit: 2018-05-28 | Discharge: 2018-05-28 | Disposition: A | Discharge status: Home / Self Care | Payer: Medicaid Other | Attending: Emergency Medicine | Admitting: Emergency Medicine

## 2018-05-28 ENCOUNTER — Emergency Department (HOSPITAL_COMMUNITY): Payer: Medicaid Other

## 2018-05-28 ENCOUNTER — Ambulatory Visit (INDEPENDENT_AMBULATORY_CARE_PROVIDER_SITE_OTHER): Payer: Medicaid Other | Admitting: Family Medicine

## 2018-05-28 ENCOUNTER — Other Ambulatory Visit: Payer: Self-pay

## 2018-05-28 VITALS — BP 158/82 | HR 60 | Temp 98.4°F | Resp 16 | Ht 66.0 in | Wt 217.0 lb

## 2018-05-28 DIAGNOSIS — Z79899 Other long term (current) drug therapy: Secondary | ICD-10-CM | POA: Insufficient documentation

## 2018-05-28 DIAGNOSIS — R51 Headache: Secondary | ICD-10-CM | POA: Diagnosis not present

## 2018-05-28 DIAGNOSIS — E0590 Thyrotoxicosis, unspecified without thyrotoxic crisis or storm: Secondary | ICD-10-CM | POA: Diagnosis not present

## 2018-05-28 DIAGNOSIS — F411 Generalized anxiety disorder: Secondary | ICD-10-CM

## 2018-05-28 DIAGNOSIS — F410 Panic disorder [episodic paroxysmal anxiety] without agoraphobia: Secondary | ICD-10-CM | POA: Diagnosis not present

## 2018-05-28 DIAGNOSIS — R079 Chest pain, unspecified: Secondary | ICD-10-CM | POA: Diagnosis present

## 2018-05-28 DIAGNOSIS — F419 Anxiety disorder, unspecified: Secondary | ICD-10-CM | POA: Insufficient documentation

## 2018-05-28 LAB — CBC WITH DIFFERENTIAL/PLATELET
Abs Immature Granulocytes: 0 10*3/uL (ref 0.0–0.1)
BASOS ABS: 0 10*3/uL (ref 0.0–0.1)
Basophils Relative: 0 %
EOS PCT: 1 %
Eosinophils Absolute: 0.1 10*3/uL (ref 0.0–0.7)
HCT: 34.6 % — ABNORMAL LOW (ref 36.0–46.0)
HEMOGLOBIN: 10.6 g/dL — AB (ref 12.0–15.0)
Immature Granulocytes: 0 %
LYMPHS PCT: 38 %
Lymphs Abs: 2.9 10*3/uL (ref 0.7–4.0)
MCH: 25 pg — ABNORMAL LOW (ref 26.0–34.0)
MCHC: 30.6 g/dL (ref 30.0–36.0)
MCV: 81.6 fL (ref 78.0–100.0)
Monocytes Absolute: 0.3 10*3/uL (ref 0.1–1.0)
Monocytes Relative: 4 %
Neutro Abs: 4.4 10*3/uL (ref 1.7–7.7)
Neutrophils Relative %: 57 %
Platelets: 281 10*3/uL (ref 150–400)
RBC: 4.24 MIL/uL (ref 3.87–5.11)
RDW: 14.9 % (ref 11.5–15.5)
WBC: 7.7 10*3/uL (ref 4.0–10.5)

## 2018-05-28 LAB — COMPREHENSIVE METABOLIC PANEL
ALBUMIN: 3.7 g/dL (ref 3.5–5.0)
ALK PHOS: 91 U/L (ref 38–126)
ALT: 11 U/L — ABNORMAL LOW (ref 14–54)
ANION GAP: 8 (ref 5–15)
AST: 16 U/L (ref 15–41)
BUN: 14 mg/dL (ref 6–20)
CO2: 26 mmol/L (ref 22–32)
Calcium: 8.9 mg/dL (ref 8.9–10.3)
Chloride: 106 mmol/L (ref 101–111)
Creatinine, Ser: 0.68 mg/dL (ref 0.44–1.00)
GFR calc Af Amer: >60 mL/min (ref >60)
GFR calc non Af Amer: >60 mL/min (ref >60)
GLUCOSE: 109 mg/dL — AB (ref 65–99)
POTASSIUM: 3.6 mmol/L (ref 3.5–5.1)
Sodium: 140 mmol/L (ref 135–145)
Total Bilirubin: 0.4 mg/dL (ref 0.3–1.2)
Total Protein: 7.3 g/dL (ref 6.5–8.1)

## 2018-05-28 LAB — I-STAT TROPONIN, ED
TROPONIN I, POC: 0 ng/mL (ref 0.00–0.08)
Troponin i, poc: 0 ng/mL (ref 0.00–0.08)

## 2018-05-28 LAB — I-STAT BETA HCG BLOOD, ED (MC, WL, AP ONLY)

## 2018-05-28 LAB — TSH: TSH: <0.01 u[IU]/mL — ABNORMAL LOW (ref 0.350–4.500)

## 2018-05-28 LAB — T4, FREE: FREE T4: 1.12 ng/dL (ref 0.82–1.77)

## 2018-05-28 MED ORDER — ALPRAZOLAM 0.25 MG PO TABS: 0.2500 mg | ORAL_TABLET | Freq: Two times a day (BID) | ORAL | 0 refills | Status: DC | PRN

## 2018-05-28 MED ORDER — ATENOLOL 25 MG PO TABS: 25.0000 mg | ORAL_TABLET | Freq: Two times a day (BID) | ORAL | 5 refills | Status: DC

## 2018-05-28 MED ORDER — BUSPIRONE HCL 5 MG PO TABS: 5.0000 mg | ORAL_TABLET | Freq: Two times a day (BID) | ORAL | 5 refills | Status: DC

## 2018-05-28 MED ORDER — HYDROXYZINE HCL 25 MG PO TABS: 25.0000 mg | ORAL_TABLET | Freq: Four times a day (QID) | ORAL | 0 refills | Status: DC

## 2018-05-28 MED FILL — ALPRAZolam 0.25 MG TABS: 0.25 | 5 days supply | Qty: 10 | Fill #0

## 2018-05-28 NOTE — ED Provider Notes (Signed)
[facility] EMERGENCY DEPARTMENT Provider Note   CSN: [id number] Arrival date & time: 05/28/18  1519     History   Chief Complaint Chief Complaint  Patient presents with  . Chest Pain    HPI Sheri Brock is a 32 y.o. female with a history of hyperthyroidism, panic attacks, and anxiety who presents to the emergency department with a chief complaint of panic attack.  She reports increasing frequency of panic attacks over the last 3 days.  She reports that she has had 4 episodes today and 3 episodes yesterday.  She had one episode 2 days ago.  She reports that before the last 3 days that she had not had a panic attack in several weeks.  She reports episodes that last for approximately 30 minutes to an hour of shortness of breath, chest pain that she characterizes as tightness or pressure, dyspnea, headache, blurred vision, and dizziness.  She states " I feel jittery all over when it happens."  She reports that she was seen by her PCP earlier today who was concerned that she was having thyroid storm and advised her to present to the emergency department if her symptoms worsen.  She was also advised to follow-up with [facility] regarding her worsening symptoms.  She reports that she left her PCPs office and went to follow-up at [facility].  After the appointment, she went home, but continued to have another episode so she came to the ED.   She also endorses left arm numbness that has been intermittent over the last few days.  She reports that the numbness has not occurred along with her other symptoms.  She denies weakness.  The patient reports that she was taking methimazole until her endocrinologist discontinued the medication about 2-1/2 weeks ago.  The history is provided by the patient. No language interpreter was used.    Past Medical History:  Diagnosis Date  . Anxiety   . Thyroid disease     Patient Active Problem List   Diagnosis Date Noted  .  Hyperthyroidism 03/27/2018  . 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 Second degree uterine prolapse 12/11/2016    Past Surgical History:  Procedure Laterality Date  . NO PAST SURGERIES       OB History    Gravida  2   Para  1   Term  1   Preterm      AB  1   Living  1     SAB      TAB      Ectopic      Multiple      Live Births  1            Home Medications    Prior to Admission medications   Medication Sig Start Date End Date Taking? Authorizing Provider  ALPRAZolam (XANAX) 0.25 MG tablet Take 1 tablet (0.25 mg total) by mouth 2 (two) times daily as needed for anxiety. 05/28/18  Yes Massie Maroon, FNP  Aromatic Inhalants (VICKS VAPOR IN) Inhale 1 application into the lungs daily as needed (congestion).    Yes [provider]  atenolol (TENORMIN) 25 MG tablet Take 1 tablet (25 mg total) by mouth 2 (two) times daily. 05/28/18 06/27/18 Yes Massie Maroon, FNP  ibuprofen (ADVIL,MOTRIN) 600 MG tablet Take 1 tablet (600 mg total) by mouth every 8 (eight) hours as needed. Patient taking differently: Take 600 mg by mouth every 8 (eight) hours as needed for moderate pain.  05/07/18  Yes Massie Maroon, FNP  busPIRone (BUSPAR) 5 MG tablet Take 1 tablet (5 mg total) by mouth 2 (two) times daily. Patient not taking: Reported on 05/28/2018 05/28/18   Massie Maroon, FNP  hydrOXYzine (ATARAX/VISTARIL) 25 MG tablet Take 1 tablet (25 mg total) by mouth every 6 (six) hours. 05/28/18   McDonald, Mia A, PA-C    Family History Family History  Problem Relation Age of Onset  . Thyroid disease Neg Hx     Social History Social History   Tobacco Use  . Smoking status: Never Smoker  . Smokeless tobacco: Never Used  Substance Use Topics  . Alcohol use: Yes  . Drug use: No     Allergies   Patient has no known allergies.   Review of Systems Review of Systems  Constitutional: Negative for activity change.  HENT: Negative for congestion  and tinnitus.   Eyes: Positive for visual disturbance.  Respiratory: Positive for shortness of breath.   Cardiovascular: Positive for chest pain.  Gastrointestinal: Negative for abdominal pain, diarrhea and vomiting.  Genitourinary: Negative for dysuria.  Musculoskeletal: Negative for back pain, neck pain and neck stiffness.  Skin: Negative for rash.  Allergic/Immunologic: Negative for immunocompromised state.  Neurological: Positive for dizziness, numbness and headaches. Negative for syncope and weakness.  Psychiatric/Behavioral: Negative for confusion.    Physical Exam Updated Vital Signs BP (!) 181/97   Pulse (!) 58   Temp 98.4 F (36.9 C) (Oral)   Resp 19   Ht 5\' 6"  (1.676 m)   LMP 05/23/2018   SpO2 100%   BMI 35.02 kg/m   Physical Exam  Constitutional: She is oriented to person, place, and time. No distress.  HENT:  Head: Normocephalic.  Eyes: Pupils are equal, round, and reactive to light. Conjunctivae and EOM are normal. Right eye exhibits no discharge. Left eye exhibits no discharge. No scleral icterus.  Neck: Normal range of motion. Neck supple. No JVD present. No tracheal deviation present.  Cardiovascular: Normal rate, regular rhythm, normal heart sounds and intact distal pulses. Exam reveals no gallop and no friction rub.  No murmur heard. Pulmonary/Chest: Effort normal. No stridor. No respiratory distress. She has no wheezes. She has no rales. She exhibits no tenderness.  Abdominal: Soft. Bowel sounds are normal. She exhibits no distension and no mass. There is no tenderness. There is no rebound and no guarding. No hernia.  Musculoskeletal: Normal range of motion. She exhibits no edema, tenderness or deformity.  Neurological: She is alert and oriented to person, place, and time.  Cranial nerves II through XII are grossly intact.  No dysmetria with finger-to-nose bilaterally.  5 out of 5 strength against resistance to bilateral upper and lower extremities.  Sensation  is intact and equal throughout.  No pronator drift.  Negative Romberg.  Symmetric tandem gait.  Skin: Skin is warm. Capillary refill takes less than 2 seconds. No rash noted. She is not diaphoretic.  Psychiatric: Her behavior is normal.  Nursing note and vitals reviewed.  ED Treatments / Results  Labs (all labs ordered are listed, but only abnormal results are displayed) Labs Reviewed  CBC WITH DIFFERENTIAL/PLATELET - Abnormal; Notable for the following components:      Result Value   Hemoglobin 10.6 (*)    HCT 34.6 (*)    MCH 25.0 (*)    All other components within normal limits  COMPREHENSIVE METABOLIC PANEL - Abnormal; Notable for the following components:   Glucose, Bld 109 (*)  ALT 11 (*)    All other components within normal limits  TSH - Abnormal; Notable for the following components:   TSH <0.010 (*)    All other components within normal limits  T4, FREE  T3, FREE  I-STAT TROPONIN, ED  I-STAT BETA HCG BLOOD, ED ([facility], [facility], [facility] ONLY)  I-STAT TROPONIN, ED    EKG EKG Interpretation  Date/Time:  Thursday May 28 2018 15:27:04 EDT Ventricular Rate:  74 PR Interval:  166 QRS Duration: 80 QT Interval:  426 QTC Calculation: 472 R Axis:   58 Text Interpretation:  Normal sinus rhythm with sinus arrhythmia Possible Left atrial enlargement Borderline ECG Confirmed by Vanetta MuldersZackowski, Scott [phone]([id number]) on 05/28/2018 7:12:27 PM   Radiology Ct Head Wo Contrast  Result Date: 05/28/2018 CLINICAL DATA:  Headache EXAM: CT HEAD WITHOUT CONTRAST TECHNIQUE: Contiguous axial images were obtained from the base of the skull through the vertex without intravenous contrast. COMPARISON:  CT 08/10/2016 FINDINGS: Brain: No evidence of acute infarction, hemorrhage, hydrocephalus, extra-axial collection or mass lesion/mass effect. Vascular: No hyperdense vessel or unexpected calcification. Skull: Negative Sinuses/Orbits: Negative Other: None IMPRESSION: Negative CT head Electronically Signed   By: Marlan Palauharles   Clark M.D.   On: 05/28/2018 20:15    Procedures Procedures (including critical care time)  Medications Ordered in ED Medications - No data to display   Initial Impression / Assessment and Plan / ED Course  I have reviewed the triage vital signs and the nursing notes.  Pertinent labs & imaging results that were available during my care of the patient were reviewed by me and considered in my medical decision making (see chart for details).     32 year old female history of hyperthyroidism, panic attacks, and anxiety who presents to the emergency department with a chief complaint of panic attack symptoms that include chest pain, dyspnea, dizziness, headache, and blurred vision.  She also reports intermittent episodes of left arm numbness over the last few days.  She was seen earlier today by her PCP who advised her to come to the ED if her symptoms persisted.  She has had 7 panic attacks over the last 2 days.  She reports that before the last 3 days she is gone several weeks without having a single panic attack.   She has a history of hyperthyroidism and her home methimazole was stopped by her endocrinologist 15 days ago so that she could have a radioactive iodine scan.  Her PCP this morning was concerned that the patient may be having thyroid storm and was advised to come to the ED if her symptoms continued.  She is hemodynamically stable without tachycardia or hyperpyrexia.  BP is 131/78.  She has no complaints at this time.  Labs are notable for TSH <0.010.  Labs are otherwise reassuring, but free T4 and free T3 are pending.  On exam, she has no focal neurologic effects.  The patient was discussed with Dr. Deretha EmoryZackowski, attending physician.  CT head was ordered due to new left arm numbness, which was unremarkable.  On reevaluation, free T4 has resulted and is normal at 1.12.  These findings were discussed with the patient and her partner.  Suspect increasing panic attacks in the setting of  subclinical hyperthyroidism.  Doubt CVA, SAH, ICH, thyrotoxicosis.   Will discharge the patient with Vistaril and follow-up with her PCP and endocrinologist.  The patient is agreeable with the plan at this time.  Strict return precautions given.  She is in no acute distress and hemodynamically stable.  She is safe for discharge home with outpatient follow-up.  Final Clinical Impressions(s) / ED Diagnoses   Final diagnoses:  Anxiety  Subclinical hyperthyroidism    ED Discharge Orders        Ordered    hydrOXYzine (ATARAX/VISTARIL) 25 MG tablet  Every 6 hours     05/28/18 2205       McDonald, Mia A, PA-C 05/29/18 0002    Vanetta Mulders, MD 05/29/18 1420

## 2018-05-28 NOTE — ED Notes (Signed)
Patient transported to CT 

## 2018-05-28 NOTE — Discharge Instructions (Addendum)
Thank you for allowing me to provide your care today in the emergency department.  Please call and schedule follow-up appointment with your primary care provider for a recheck within the next week.  Take 1 tablet of Atarax/hydroxyzine every 6 hours as needed for anxiety.  If your symptoms do not improve with this medication, you can take Xanax as prescribed by your primary care provider.  Please continue to follow up with your endocrinologist regarding your scan with the radioactive dye iodine that they are trying to schedule for you.   Return to the emergency department if you develop new or worsening symptoms including constant, severe chest pain, shortness of breath, if you pass out, develop a high fever, continued, worsening panic attacks, or other new, concerning symptoms.

## 2018-05-28 NOTE — ED Triage Notes (Signed)
Pt sent by PCP for evaluation of chest pain/shortness of breath/tachycardia x 2 days. Pt has hx hyperthyroidism and has been of her medications recently. No shortness of breath or chest pain at this time.

## 2018-05-28 NOTE — Progress Notes (Signed)
Subjective:     Sheri BeamsStephanie Brock is a 32 y.o. female with a history of hyperthyroidism presents complaining of increased anxiety over the past several days.  Patient states that she had to notify paramedics on 05/27/2018 after experiencing a panic attack.  She states that panic attacks subsided upon arrival of EMT.  She currently endorses anxiety.  Associated symptoms include inability to control worrying, panic, chest tightness, feelings of losing control, irritable, palpitations and racing thoughts. Onset of symptoms was approximately 3 days ago. Symptoms have been intermittent since that time. She denies current suicidal and homicidal ideation.   Patient also has a history of hyperthyroidism.  She is under the care of Dr. Everardo AllEllison, endocrinologist for this problem.  Patient states that methimazole has been discontinued and the plan is to start a treatment pill of radioactive iodine.  Past Medical History:  Diagnosis Date  . Anxiety   . Thyroid disease    Social History   Socioeconomic History  . Marital status: Single    Spouse name: Not on file  . Number of children: Not on file  . Years of education: Not on file  . Highest education level: Not on file  Occupational History  . Not on file  Social Needs  . Financial resource strain: Not on file  . Food insecurity:    Worry: Not on file    Inability: Not on file  . Transportation needs:    Medical: Not on file    Non-medical: Not on file  Tobacco Use  . Smoking status: Never Smoker  . Smokeless tobacco: Never Used  Substance and Sexual Activity  . Alcohol use: Yes  . Drug use: No  . Sexual activity: Not on file  Lifestyle  . Physical activity:    Days per week: Not on file    Minutes per session: Not on file  . Stress: Not on file  Relationships  . Social connections:    Talks on phone: Not on file    Gets together: Not on file    Attends religious service: Not on file    Active member of club or organization: Not on file     Attends meetings of clubs or organizations: Not on file    Relationship status: Not on file  . Intimate partner violence:    Fear of current or ex partner: Not on file    Emotionally abused: Not on file    Physically abused: Not on file    Forced sexual activity: Not on file  Other Topics Concern  . Not on file  Social History Narrative  . Not on file   Immunization History  Administered Date(s) Administered  . Tdap 03/20/2018   No Known Allergies Review of Systems  Constitutional: Negative.  Negative for fever and malaise/fatigue.  HENT: Negative.   Eyes: Negative.   Respiratory: Negative.   Cardiovascular: Positive for palpitations.  Gastrointestinal: Negative.   Genitourinary: Negative.   Musculoskeletal: Negative.   Skin: Negative.   Endo/Heme/Allergies: Negative.   Psychiatric/Behavioral: Negative for depression and suicidal ideas. The patient is nervous/anxious and has insomnia.     Objective:    Physical Exam  Constitutional: She appears well-developed and well-nourished.  Eyes: Pupils are equal, round, and reactive to light.  Neck: No thyromegaly present.  Cardiovascular: Normal rate, regular rhythm and normal heart sounds.  Pulmonary/Chest: Effort normal and breath sounds normal.  Abdominal: Soft. Bowel sounds are normal.  Neurological: She is alert.  Skin: Skin is warm and  dry.  Psychiatric: Her speech is normal and behavior is normal. Judgment and thought content normal. Her mood appears anxious. Her affect is labile. Cognition and memory are normal. She expresses no homicidal and no suicidal ideation.    Assessment:   BP (!) 158/82 Comment: manualy  Pulse 60   Temp 98.4 F (36.9 C) (Oral)   Resp 16   Ht 5\' 6"  (1.676 m)   Wt 217 lb (98.4 kg)   LMP 05/23/2018   SpO2 100%   BMI 35.02 kg/m  Plan:  1. Hyperthyroidism I suspect the patient may have been experiencing thyroid storm due to increased stress.  Discussed at length the symptoms of thyroid  storm.  Patient advised to follow-up in the emergency department if she experiences symptoms of increased agitation, fever, markedly elevated blood pressure, and elevated heart rate. - atenolol (TENORMIN) 25 MG tablet; Take 1 tablet (25 mg total) by mouth 2 (two) times daily.  Dispense: 60 tablet; Refill: 5 - Thyroid Panel With TSH  2. Anxiety, generalized For generalized anxiety I notified [facility], patient will report immediately to walk-in clinic for further work-up and evaluation.  Also started a trial of alprazolam 0.25 mg twice daily as needed for acute anxiety or panic attacks. Also, started a trial of BuSpar 5 mg twice daily for anxiety - ALPRAZolam (XANAX) 0.25 MG tablet; Take 1 tablet (0.25 mg total) by mouth 2 (two) times daily as needed for anxiety.  Dispense: 10 tablet; Refill: 0 - busPIRone (BUSPAR) 5 MG tablet; Take 1 tablet (5 mg total) by mouth 2 (two) times daily.  Dispense: 60 tablet; Refill: 5  3. Panic attack - ALPRAZolam (XANAX) 0.25 MG tablet; Take 1 tablet (0.25 mg total) by mouth 2 (two) times daily as needed for anxiety.  Dispense: 10 tablet; Refill: 0  Spent 40 minutes (>50% of visit) discussing the risks of anxiety disorder and panic attacks, the  pathophysiology, etiology, risks, and principles of treatment.     RTC; 1 month for GAD  The patient was given clear instructions to go to ER or return to medical center if symptoms do not improve, worsen or new problems develop. The patient verbalized understanding. Will notify patient with laboratory results.    Nolon Nations  MSN, FNP-C Patient [facility] [address] [place], [place] [address] [phone]

## 2018-05-28 NOTE — ED Notes (Signed)
Pt is resting and appears comfortable with family at bedside.  Pt denies any complaints.  Waiting for CT result.

## 2018-05-28 NOTE — Patient Instructions (Addendum)
Blood pressure is elevated, will increase atenolol to 25 mg twice daily Return in 1 week for blood pressure/pulse  [facility] [address] [place], [place] [phone]  Will start a trial of Buspar 5 mg twice daily Xanax 0.25 mg twice daily as needed for panic attacks

## 2018-05-28 NOTE — ED Provider Notes (Signed)
Patient placed in Quick Look pathway, seen and evaluated   Chief Complaint: # panic attacks with associated chest pain   HPI:   Patient has hx of hyperthyroidism, seen by cpc and sent in for eval of thyroid storm, has an endocrinologist, told not to take her meds by himb/c she needs thryoid ablation, having trouble with medicaid paying for her procedure  ROS: chest pain, racing heart  (one)  Physical Exam:   Gen: No distress  Neuro: Awake and Alert  Skin: Warm    Focused Exam:CTAB, thryomegaly,    Initiation of care has begun. The patient has been counseled on the process, plan, and necessity for staying for the completion/evaluation, and the remainder of the medical screening examination    Sheri Brock, Sheri Yusupov, PA-C 05/28/18 1530    Linwood DibblesKnapp, Jon, MD 05/31/18 2024

## 2018-05-29 ENCOUNTER — Ambulatory Visit: Payer: Self-pay | Admitting: Family Medicine

## 2018-05-29 LAB — THYROID PANEL WITH TSH
Free Thyroxine Index: 2.6 (ref 1.2–4.9)
T3 UPTAKE RATIO: 26 % (ref 24–39)
T4, Total: 10 ug/dL (ref 4.5–12.0)
TSH: <0.006 u[IU]/mL — ABNORMAL LOW (ref 0.450–4.500)

## 2018-05-29 LAB — T3, FREE: T3, Free: 5.2 pg/mL — ABNORMAL HIGH (ref 2.0–4.4)

## 2018-06-02 ENCOUNTER — Encounter: Payer: Self-pay | Admitting: Endocrinology

## 2018-06-03 ENCOUNTER — Other Ambulatory Visit: Payer: Self-pay | Admitting: Endocrinology

## 2018-06-03 MED ORDER — METHIMAZOLE 10 MG PO TABS: 10.0000 mg | ORAL_TABLET | Freq: Two times a day (BID) | ORAL | 5 refills | Status: DC

## 2018-06-05 ENCOUNTER — Ambulatory Visit (INDEPENDENT_AMBULATORY_CARE_PROVIDER_SITE_OTHER): Payer: Medicaid Other | Admitting: Family Medicine

## 2018-06-05 ENCOUNTER — Encounter: Payer: Self-pay | Admitting: Family Medicine

## 2018-06-05 VITALS — BP 136/82 | HR 62 | Temp 97.8°F | Resp 16 | Ht 66.0 in | Wt 216.0 lb

## 2018-06-05 DIAGNOSIS — E0590 Thyrotoxicosis, unspecified without thyrotoxic crisis or storm: Secondary | ICD-10-CM | POA: Diagnosis not present

## 2018-06-05 DIAGNOSIS — F411 Generalized anxiety disorder: Secondary | ICD-10-CM

## 2018-06-05 NOTE — Patient Instructions (Signed)
Recommend that you follow-up with endocrinologist as scheduled    Also, recommend that you follow[facility] with [facility] as scheduled.  Start trial of BuSpar 5 mg twice daily as prescribed.  Only take Xanax 0.25 mg as needed for panic attacks    We will fax FMLA forms to employer for 06/10/2018 Generalized Anxiety Disorder, Adult Generalized anxiety disorder (GAD) is a mental health disorder. People with this condition constantly worry about everyday events. Unlike normal anxiety, worry related to GAD is not triggered by a specific event. These worries also do not fade or get better with time. GAD interferes with life functions, including relationships, work, and school. GAD can vary from mild to severe. People with severe GAD can have intense waves of anxiety with physical symptoms (panic attacks). What are the causes? The exact cause of GAD is not known. What increases the risk? This condition is more likely to develop in:  Women.  People who have a family history of anxiety disorders.  People who are very shy.  People who experience very stressful life events, such as the death of a loved one.  People who have a very stressful family environment.  What are the signs or symptoms? People with GAD often worry excessively about many things in their lives, such as their health and family. They may also be overly concerned about:  Doing well at work.  Being on time.  Natural disasters.  Friendships.  Physical symptoms of GAD include:  Fatigue.  Muscle tension or having muscle twitches.  Trembling or feeling shaky.  Being easily startled.  Feeling like your heart is pounding or racing.  Feeling out of breath or like you cannot take a deep breath.  Having trouble falling asleep or staying asleep.  Sweating.  Nausea, diarrhea, or irritable bowel syndrome (IBS).  Headaches.  Trouble concentrating or remembering  facts.  Restlessness.  Irritability.  How is this diagnosed? Your health care provider can diagnose GAD based on your symptoms and medical history. You will also have a physical exam. The health care provider will ask specific questions about your symptoms, including how severe they are, when they started, and if they come and go. Your health care provider may ask you about your use of alcohol or drugs, including prescription medicines. Your health care provider may refer you to a mental health specialist for further evaluation. Your health care provider will do a thorough examination and may perform additional tests to rule out other possible causes of your symptoms. To be diagnosed with GAD, a person must have anxiety that:  Is out of his or her control.  Affects several different aspects of his or her life, such as work and relationships.  Causes distress that makes him or her unable to take part in normal activities.  Includes at least three physical symptoms of GAD, such as restlessness, fatigue, trouble concentrating, irritability, muscle tension, or sleep problems.  Before your health care provider can confirm a diagnosis of GAD, these symptoms must be present more days than they are not, and they must last for six months or longer. How is this treated? The following therapies are usually used to treat GAD:  Medicine. Antidepressant medicine is usually prescribed for long-term daily control. Antianxiety medicines may be added in severe cases, especially when panic attacks occur.  Talk therapy (psychotherapy). Certain types of talk therapy can be helpful in treating GAD by providing support, education, and guidance. Options include: ? Cognitive behavioral therapy (CBT). People learn  coping skills and techniques to ease their anxiety. They learn to identify unrealistic or negative thoughts and behaviors and to replace them with positive ones. ? Acceptance and commitment therapy (ACT).  This treatment teaches people how to be mindful as a way to cope with unwanted thoughts and feelings. ? Biofeedback. This process trains you to manage your body's response (physiological response) through breathing techniques and relaxation methods. You will work with a therapist while machines are used to monitor your physical symptoms.  Stress management techniques. These include yoga, meditation, and exercise.  A mental health specialist can help determine which treatment is best for you. Some people see improvement with one type of therapy. However, other people require a combination of therapies. Follow these instructions at home:  Take over-the-counter and prescription medicines only as told by your health care provider.  Try to maintain a normal routine.  Try to anticipate stressful situations and allow extra time to manage them.  Practice any stress management or self-calming techniques as taught by your health care provider.  Do not punish yourself for setbacks or for not making progress.  Try to recognize your accomplishments, even if they are small.  Keep all follow-up visits as told by your health care provider. This is important. Contact a health care provider if:  Your symptoms do not get better.  Your symptoms get worse.  You have signs of depression, such as: ? A persistently sad, cranky, or irritable mood. ? Loss of enjoyment in activities that used to bring you joy. ? Change in weight or eating. ? Changes in sleeping habits. ? Avoiding friends or family members. ? Loss of energy for normal tasks. ? Feelings of guilt or worthlessness. Get help right away if:  You have serious thoughts about hurting yourself or others. If you ever feel like you may hurt yourself or others, or have thoughts about taking your own life, get help right away. You can go to your nearest emergency department or call:  Your local emergency services (911 in the [place]  A suicide  crisis helpline, such as the National Suicide Prevention Lifeline at 862-313-8239. This is open 24 hours a day.  Summary  Generalized anxiety disorder (GAD) is a mental health disorder that involves worry that is not triggered by a specific event.  People with GAD often worry excessively about many things in their lives, such as their health and family.  GAD may cause physical symptoms such as restlessness, trouble concentrating, sleep problems, frequent sweating, nausea, diarrhea, headaches, and trembling or muscle twitching.  A mental health specialist can help determine which treatment is best for you. Some people see improvement with one type of therapy. However, other people require a combination of therapies. This information is not intended to replace advice given to you by your health care provider. Make sure you discuss any questions you have with your health care provider. Document Released: 03/22/2013 Document Revised: 10/15/2016 Document Reviewed: 10/15/2016 Elsevier Interactive Patient Education  [employer].

## 2018-06-05 NOTE — Progress Notes (Signed)
Subjective:     Sheri BeamsStephanie Horlacher is a 32 y.o. female with a history of hyperthyroidism presents accompanied by partner for a follow up of increased anxiety over the past several weeks.  She currently endorses anxiety and panic attacks.  Associated symptoms include inability to control worrying, panic, chest tightness, feelings of losing control, irritable, palpitations and racing thoughts. A trial of Buspar was prescribed, but patient says that she has not started medication. She admits that she is typically non compliant with pills. Patient was also advised to follow up with [facility]. She established care with psychiatrist and has a follow up appointment scheduled. Onset of symptoms was approximately 3 weeks ago. Symptoms have been intermittent since that time. She denies current suicidal and homicidal ideation.   Patient also has a history of hyperthyroidism.  She is under the care of Dr. Everardo AllEllison, endocrinologist for this problem.  Patient states that methimazole has been discontinued and the plan is to start a treatment pill of radioactive iodine.  Past Medical History:  Diagnosis Date  . Anxiety   . Thyroid disease    Social History   Socioeconomic History  . Marital status: Single    Spouse name: Not on file  . Number of children: Not on file  . Years of education: Not on file  . Highest education level: Not on file  Occupational History  . Not on file  Social Needs  . Financial resource strain: Not on file  . Food insecurity:    Worry: Not on file    Inability: Not on file  . Transportation needs:    Medical: Not on file    Non-medical: Not on file  Tobacco Use  . Smoking status: Never Smoker  . Smokeless tobacco: Never Used  Substance and Sexual Activity  . Alcohol use: Yes  . Drug use: No  . Sexual activity: Not on file  Lifestyle  . Physical activity:    Days per week: Not on file    Minutes per session: Not on file  . Stress: Not on file   Relationships  . Social connections:    Talks on phone: Not on file    Gets together: Not on file    Attends religious service: Not on file    Active member of club or organization: Not on file    Attends meetings of clubs or organizations: Not on file    Relationship status: Not on file  . Intimate partner violence:    Fear of current or ex partner: Not on file    Emotionally abused: Not on file    Physically abused: Not on file    Forced sexual activity: Not on file  Other Topics Concern  . Not on file  Social History Narrative  . Not on file   Immunization History  Administered Date(s) Administered  . Tdap 03/20/2018   No Known Allergies Review of Systems  Constitutional: Negative.  Negative for fever and malaise/fatigue.  HENT: Negative.   Eyes: Negative.   Respiratory: Negative.   Cardiovascular: Positive for palpitations.  Gastrointestinal: Negative.   Genitourinary: Negative.   Musculoskeletal: Negative.   Skin: Negative.   Endo/Heme/Allergies: Negative.   Psychiatric/Behavioral: Negative for depression and suicidal ideas. The patient is nervous/anxious and has insomnia.     Objective:    Physical Exam  Constitutional: She appears well-developed and well-nourished.  Eyes: Pupils are equal, round, and reactive to light.  Neck: No thyromegaly present.  Cardiovascular: Normal rate, regular rhythm  and normal heart sounds.  Pulmonary/Chest: Effort normal and breath sounds normal.  Abdominal: Soft. Bowel sounds are normal.  Neurological: She is alert.  Skin: Skin is warm and dry.  Psychiatric: Her speech is normal and behavior is normal. Judgment and thought content normal. Her mood appears anxious. Her affect is labile. Cognition and memory are normal. She expresses no homicidal and no suicidal ideation.    Assessment:   BP (!) 144/84 (BP Location: Left Arm, Patient Position: Sitting, Cuff Size: Normal) Comment: manually  Pulse 62   Temp 97.8 F (36.6 C)  (Oral)   Resp 16   Ht 5\' 6"  (1.676 m)   Wt 216 lb (98 kg)   LMP 05/23/2018   SpO2 100%   BMI 34.86 kg/m  Plan:  1. Hyperthyroidism Follow up with endocrinology as scheduled.  Patient advised to follow-up in the emergency department if she experiences symptoms of increased agitation, fever, markedly elevated blood pressure, and elevated heart rate.  2. Anxiety, generalized Discussed starting Buspar 5 mg BID at length. Also, discussed the importance of taking medication consistently in order to achieve positive outcomes.  Follow up with behavorial health services as scheduled. She currently denies suicidal or homicidal intent.    RTC: 6 weeks for GAD  The patient was given clear instructions to go to ER or return to medical center if symptoms do not improve, worsen or new problems develop. The patient verbalized understanding. Will notify patient with laboratory results.    Nolon Nations  MSN, FNP-C Patient Care [facility] [address] [place], [place] [address] [phone]

## 2018-06-19 ENCOUNTER — Emergency Department (HOSPITAL_COMMUNITY)
Admission: EM | Admit: 2018-06-19 | Discharge: 2018-06-20 | Disposition: A | Discharge status: Home / Self Care | Payer: Medicaid Other | Attending: Emergency Medicine | Admitting: Emergency Medicine

## 2018-06-19 DIAGNOSIS — I10 Essential (primary) hypertension: Secondary | ICD-10-CM | POA: Diagnosis not present

## 2018-06-19 DIAGNOSIS — F410 Panic disorder [episodic paroxysmal anxiety] without agoraphobia: Secondary | ICD-10-CM | POA: Diagnosis not present

## 2018-06-19 DIAGNOSIS — R0602 Shortness of breath: Secondary | ICD-10-CM | POA: Insufficient documentation

## 2018-06-19 DIAGNOSIS — R1013 Epigastric pain: Secondary | ICD-10-CM | POA: Insufficient documentation

## 2018-06-19 DIAGNOSIS — Z79899 Other long term (current) drug therapy: Secondary | ICD-10-CM | POA: Diagnosis not present

## 2018-06-19 DIAGNOSIS — R110 Nausea: Secondary | ICD-10-CM | POA: Insufficient documentation

## 2018-06-19 DIAGNOSIS — R0789 Other chest pain: Secondary | ICD-10-CM | POA: Diagnosis not present

## 2018-06-19 DIAGNOSIS — R079 Chest pain, unspecified: Secondary | ICD-10-CM

## 2018-06-20 ENCOUNTER — Encounter (HOSPITAL_COMMUNITY): Payer: Self-pay | Admitting: Emergency Medicine

## 2018-06-20 ENCOUNTER — Other Ambulatory Visit: Payer: Self-pay

## 2018-06-20 ENCOUNTER — Emergency Department (HOSPITAL_COMMUNITY): Payer: Medicaid Other

## 2018-06-20 LAB — CBC
HCT: 37.4 % (ref 36.0–46.0)
HEMOGLOBIN: 11.8 g/dL — AB (ref 12.0–15.0)
MCH: 25.9 pg — ABNORMAL LOW (ref 26.0–34.0)
MCHC: 31.6 g/dL (ref 30.0–36.0)
MCV: 82 fL (ref 78.0–100.0)
Platelets: 304 10*3/uL (ref 150–400)
RBC: 4.56 MIL/uL (ref 3.87–5.11)
RDW: 13.4 % (ref 11.5–15.5)
WBC: 10.1 10*3/uL (ref 4.0–10.5)

## 2018-06-20 LAB — BASIC METABOLIC PANEL
ANION GAP: 7 (ref 5–15)
BUN: 8 mg/dL (ref 6–20)
CALCIUM: 9 mg/dL (ref 8.9–10.3)
CO2: 23 mmol/L (ref 22–32)
Chloride: 108 mmol/L (ref 98–111)
Creatinine, Ser: 0.56 mg/dL (ref 0.44–1.00)
GLUCOSE: 89 mg/dL (ref 70–99)
Potassium: 3.7 mmol/L (ref 3.5–5.1)
Sodium: 138 mmol/L (ref 135–145)

## 2018-06-20 LAB — I-STAT BETA HCG BLOOD, ED (MC, WL, AP ONLY): I-stat hCG, quantitative: <5 m[IU]/mL (ref <5)

## 2018-06-20 LAB — I-STAT TROPONIN, ED: TROPONIN I, POC: 0 ng/mL (ref 0.00–0.08)

## 2018-06-20 MED ORDER — IBUPROFEN 800 MG PO TABS: 800.0000 mg | ORAL_TABLET | Freq: Three times a day (TID) | ORAL | 0 refills | Status: DC

## 2018-06-20 NOTE — ED Provider Notes (Signed)
[facility] Provider Note   CSN: [id number] Arrival date & time: 06/19/18  2337     History   Chief Complaint Chief Complaint  Patient presents with  . Chest Pain    HPI Cathie BeamsStephanie Difatta is a 32 y.o. female.  Patient presents to the emergency department with a chief complaint of epigastric pain.  She reports some burning and pain sensation that started about 8 PM.  She also reports having some shortness of breath and nausea and having a panic attack after the symptoms started.  She attributes the symptoms to the panic attack, and thinks that she was just concerned about the burning sensation in her chest.  She came to the emergency department to ensure that she did not have a heart attack.  She has not taken anything for symptoms.  She has had similar symptoms in the past.  She states that she is feeling better now.  Her chest pain symptoms are worsened with palpation.  She denies any fever, chills, cough.  She has not taken anything for symptoms.  The history is provided by the patient. No language interpreter was used.    Past Medical History:  Diagnosis Date  . Anxiety   . Thyroid disease     Patient Active Problem List   Diagnosis Date Noted  . Hyperthyroidism 03/27/2018  . 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 Second degree uterine prolapse 12/11/2016    Past Surgical History:  Procedure Laterality Date  . NO PAST SURGERIES       OB History    Gravida  2   Para  1   Term  1   Preterm      AB  1   Living  1     SAB      TAB      Ectopic      Multiple      Live Births  1            Home Medications    Prior to Admission medications   Medication Sig Start Date End Date Taking? Authorizing Provider  ALPRAZolam (XANAX) 0.25 MG tablet Take 1 tablet (0.25 mg total) by mouth 2 (two) times daily as needed for anxiety. 05/28/18   Massie MaroonHollis, Lachina M, FNP  atenolol (TENORMIN) 25 MG tablet Take 1  tablet (25 mg total) by mouth 2 (two) times daily. 05/28/18 06/27/18  Massie MaroonHollis, Lachina M, FNP  busPIRone (BUSPAR) 5 MG tablet Take 1 tablet (5 mg total) by mouth 2 (two) times daily. Patient not taking: Reported on 05/28/2018 05/28/18   Massie MaroonHollis, Lachina M, FNP  hydrOXYzine (ATARAX/VISTARIL) 25 MG tablet Take 1 tablet (25 mg total) by mouth every 6 (six) hours. Patient not taking: Reported on 06/05/2018 05/28/18   McDonald, Pedro EarlsMia A, PA-C  ibuprofen (ADVIL,MOTRIN) 600 MG tablet Take 1 tablet (600 mg total) by mouth every 8 (eight) hours as needed. Patient taking differently: Take 600 mg by mouth every 8 (eight) hours as needed for moderate pain.  05/07/18   Massie MaroonHollis, Lachina M, FNP  methimazole (TAPAZOLE) 10 MG tablet Take 1 tablet (10 mg total) by mouth 2 (two) times daily. 06/03/18   Romero BellingEllison, Sean, MD    Family History Family History  Problem Relation Age of Onset  . Thyroid disease Neg Hx     Social History Social History   Tobacco Use  . Smoking status: Never Smoker  . Smokeless tobacco: Never Used  Substance Use Topics  .  Alcohol use: Yes  . Drug use: No     Allergies   Patient has no known allergies.   Review of Systems Review of Systems  All other systems reviewed and are negative.    Physical Exam Updated Vital Signs BP (!) 152/101 (BP Location: Right Arm)   Pulse 72   Temp 98.2 F (36.8 C) (Oral)   Resp 18   LMP 06/20/2018 Comment: pt shielded  SpO2 99%   Physical Exam  Constitutional: She is oriented to person, place, and time. She appears well-developed and well-nourished.  HENT:  Head: Normocephalic and atraumatic.  Eyes: Pupils are equal, round, and reactive to light. Conjunctivae and EOM are normal.  Neck: Normal range of motion. Neck supple.  Cardiovascular: Normal rate and regular rhythm. Exam reveals no gallop and no friction rub.  No murmur heard. Anterior chest wall somewhat tender to palpation, no erythema  Pulmonary/Chest: Effort normal and breath sounds  normal. No respiratory distress. She has no wheezes. She has no rales. She exhibits no tenderness.  Clear to auscultation bilaterally  Abdominal: Soft. Bowel sounds are normal. She exhibits no distension and no mass. There is no tenderness. There is no rebound and no guarding.  Musculoskeletal: Normal range of motion. She exhibits no edema or tenderness.  Neurological: She is alert and oriented to person, place, and time.  Skin: Skin is warm and dry.  Psychiatric: She has a normal mood and affect. Her behavior is normal. Judgment and thought content normal.  Nursing note and vitals reviewed.    ED Treatments / Results  Labs (all labs ordered are listed, but only abnormal results are displayed) Labs Reviewed  CBC - Abnormal; Notable for the following components:      Result Value   Hemoglobin 11.8 (*)    MCH 25.9 (*)    All other components within normal limits  BASIC METABOLIC PANEL  I-STAT TROPONIN, ED  I-STAT BETA HCG BLOOD, ED ([facility], [facility], [facility] ONLY)    EKG   Radiology Dg Chest 2 View  Result Date: 06/20/2018 CLINICAL DATA:  32 year old female with chest pain. EXAM: CHEST - 2 VIEW COMPARISON:  Chest radiograph dated 04/07/2018 FINDINGS: The heart size and mediastinal contours are within normal limits. Both lungs are clear. The visualized skeletal structures are unremarkable. IMPRESSION: No active cardiopulmonary disease. Electronically Signed   By: Elgie Collard M.D.   On: 06/20/2018 00:57    Procedures Procedures (including critical care time)  Medications Ordered in ED Medications - No data to display   Initial Impression / Assessment and Plan / ED Course  I have reviewed the triage vital signs and the nursing notes.  Pertinent labs & imaging results that were available during my care of the patient were reviewed by me and considered in my medical decision making (see chart for details).     Patient presents with chest pain/epigastric pain/burning since 8  PM.  Review of prior visits show prior history of panic attacks with associated chest pain.  DDx includes ACS, PE, pneumothorax, aortic dissection, esophageal rupture, pericarditis, chest wall pain.  Doubt ACS, normal troponin, no ischemic EKG findings, HEART score is: 1.  Low risk for PE, patient is not tachycardic nor hypoxic.  No evidence of pneumothorax on CXR.  Doubt dissection, no mediastinal widening on CXR, no ripping/tearing chest pain, neurovascularly intact.  Doubt pericarditis, no positional changes, or diffuse ST elevations on EKG.    The pain is easily reproducible with palpation, could be costochondritis or  MSK.  Will treat with ibuprofen and PCP follow-up.  Return precautions discussed.  All findings were discussed with patient.  Patient understands and agrees with the plan.     Final Clinical Impressions(s) / ED Diagnoses   Final diagnoses:  Nonspecific chest pain    ED Discharge Orders    None       Roxy Horseman, PA-C 06/20/18 1610    Geoffery Lyons, MD 06/20/18 [phone]

## 2018-06-20 NOTE — ED Triage Notes (Signed)
Pt to triage via [facility].  Reports epigastric pain/burning since 8pm.  Also reports sob, nausea, and having anxiety attack.

## 2018-06-22 MED FILL — busPIRone HCL 5 MG TABS: 5 | 30 days supply | Qty: 60 | Fill #0

## 2018-06-22 MED FILL — methIMAzole 10 MG TABS: 10 | 15 days supply | Qty: 68 | Fill #3

## 2018-06-22 MED FILL — ATENOLOL 25 MG TABLET: 25 | 30 days supply | Qty: 30 | Fill #3

## 2018-06-30 ENCOUNTER — Ambulatory Visit: Payer: Self-pay

## 2018-07-01 ENCOUNTER — Other Ambulatory Visit: Payer: Self-pay | Admitting: Family Medicine

## 2018-07-20 ENCOUNTER — Ambulatory Visit (INDEPENDENT_AMBULATORY_CARE_PROVIDER_SITE_OTHER): Payer: Medicaid Other | Admitting: Family Medicine

## 2018-07-20 ENCOUNTER — Encounter: Payer: Self-pay | Admitting: Family Medicine

## 2018-07-20 VITALS — BP 140/82 | HR 51 | Temp 98.4°F | Resp 14 | Ht 66.0 in | Wt 213.0 lb

## 2018-07-20 DIAGNOSIS — F329 Major depressive disorder, single episode, unspecified: Secondary | ICD-10-CM

## 2018-07-20 DIAGNOSIS — F411 Generalized anxiety disorder: Secondary | ICD-10-CM

## 2018-07-20 DIAGNOSIS — F410 Panic disorder [episodic paroxysmal anxiety] without agoraphobia: Secondary | ICD-10-CM

## 2018-07-20 DIAGNOSIS — F32A Depression, unspecified: Secondary | ICD-10-CM

## 2018-07-20 MED ORDER — ALPRAZOLAM 0.25 MG PO TABS: 0.2500 mg | ORAL_TABLET | Freq: Two times a day (BID) | ORAL | 0 refills | Status: DC | PRN

## 2018-07-20 MED ORDER — SERTRALINE HCL 25 MG PO TABS: 25.0000 mg | ORAL_TABLET | Freq: Every day | ORAL | 0 refills | Status: DC

## 2018-07-20 MED FILL — IBUPROFEN 800 MG TABLET: 800 | 7 days supply | Qty: 21 | Fill #0

## 2018-07-20 MED FILL — ATENOLOL 25 MG TABLET: 25 | 30 days supply | Qty: 30 | Fill #4

## 2018-07-20 MED FILL — methIMAzole 10 MG TABS: 10 | 15 days supply | Qty: 68 | Fill #4

## 2018-07-20 NOTE — Progress Notes (Signed)
.depr Subjective:   Sheri Brock is an 32 y.o. female who presents for evaluation and treatment of depressive symptoms with anxiety.  Onset approximately 1 year ago, gradually worsening since that time.  Current symptoms include depressed mood, anhedonia, insomnia, fatigue, difficulty concentrating, anxiety, panic attacks,.  Current treatment for depression: Patient stopped taking buspar and vistaril. States that she had "an out of body experience".  Sleep problems: Moderate   Early awakening:Moderate   Energy: Limited Motivation: Limited Concentration: Fair Rumination/worrying: Moderate Memory: Good Tearfulness: Mild  Anxiety: Moderate  Panic: Moderate  Overall Mood: Minimally worse  Hopelessness: Mild Suicidal ideation: Absent  Other/Psychosocial Stressors: None reported currently.  Family history positive for depression in the patient's unknown.  Previous treatment modalities employed include Medication.  Past episodes of depression:yes Organic causes of depression present: None.  Review of Systems Pertinent items noted in HPI and remainder of comprehensive ROS otherwise negative.   Objective:    BP 140/82 (BP Location: Left Arm, Patient Position: Sitting, Cuff Size: Normal)   Pulse (!) 51   Temp 98.4 F (36.9 C) (Oral)   Resp 14   Ht 5\' 6"  (1.676 m)   Wt 213 lb (96.6 kg)   LMP 07/17/2018 Comment: pt shielded  SpO2 100%   BMI 34.38 kg/m    Mental Status Examination: Posture and motor behavior: Appropriate Dress, grooming, personal hygiene: Appropriate Facial expression: Appropriate Speech: Appropriate Mood: Appropriate Coherency and relevance of thought: Appropriate Thought content: Appropriate Perceptions: Appropriate Orientation:Appropriate Attention and concentration: Appropriate Memory: : Appropriate Information: Negative Vocabulary: Appropriate Abstract reasoning: Appropriate Judgment: Appropriate    Assessment:   Experiencing the following  symptoms of depression most of the day nearly every day for more than two consecutive weeks: depressed mood, loss of interests/pleasure, change in sleep, loss of energy, trouble concentrating  Encounter Diagnoses  Name Primary?  Marland Kitchen. Anxiety, generalized   . Panic attack      Suicide Risk Assessment: low   Suicidal intent: none Suicidal plan: none   Plan:  1. Anxiety, generalized - ALPRAZolam (XANAX) 0.25 MG tablet; Take 1 tablet (0.25 mg total) by mouth 2 (two) times daily as needed for anxiety.  Dispense: 10 tablet; Refill: 0 - sertraline (ZOLOFT) 25 MG tablet; Take 1 tablet (25 mg total) by mouth daily. One pill by mouth every day x 2 weeks. Then increase to 2 pills by mouth for a total of 50 mg po QDAY.  Dispense: 45 tablet; Refill: 0  2. Panic attack - ALPRAZolam (XANAX) 0.25 MG tablet; Take 1 tablet (0.25 mg total) by mouth 2 (two) times daily as needed for anxiety.  Dispense: 10 tablet; Refill: 0 - sertraline (ZOLOFT) 25 MG tablet; Take 1 tablet (25 mg total) by mouth daily. One pill by mouth every day x 2 weeks. Then increase to 2 pills by mouth for a total of 50 mg po QDAY.  Dispense: 45 tablet; Refill: 0  3. Generalized anxiety disorder Recommend CBT  4. Depression, unspecified depression type - sertraline (ZOLOFT) 25 MG tablet; Take 1 tablet (25 mg total) by mouth daily. One pill by mouth every day x 2 weeks. Then increase to 2 pills by mouth for a total of 50 mg po QDAY.  Dispense: 45 tablet; Refill: 0   I am starting you on a new medication in the SSRI/SNRI family for your depression/anxiety. Take this medication daily as it has been prescribed. You may experience gastrointestinal upset. This usually will stop after you are on the medication for a  few days. If you have feelings of euphoria (happy and high), stop the medication immediately and go to the nearest emergency department. If you have suicidal or homicidal thoughts, delusions or hallucinations, stop the medication  immediately and go to the nearest emergency department. I would like to see you again in 2 weeks. .     The patient was given clear instructions to go to ER or return to medical center if symptoms do not improve, worsen or new problems develop. The patient verbalized understanding and agreed with plan of care.    [name], FNP-BC [facility] [address]  [place], [place] [id number] [phone]

## 2018-07-20 NOTE — Patient Instructions (Signed)
I am starting you on a new medication in the SSRI/SNRI family for your depression/anxiety. Take this medication daily as it has been prescribed. You may experience gastrointestinal upset. This usually will stop after you are on the medication for a few days. If you have feelings of euphoria (happy and high), stop the medication immediately and go to the nearest emergency department. If you have suicidal or homicidal thoughts, delusions or hallucinations, stop the medication immediately and go to the nearest emergency department. I would like to see you again in 3 weeks.  Sertraline tablets What is this medicine? SERTRALINE (SER tra leen) is used to treat depression. It may also be used to treat obsessive compulsive disorder, panic disorder, post-trauma stress, premenstrual dysphoric disorder (PMDD) or social anxiety. This medicine may be used for other purposes; ask your health care provider or pharmacist if you have questions. COMMON BRAND NAME(S): Zoloft What should I tell my health care provider before I take this medicine? They need to know if you have any of these conditions: -bleeding disorders -bipolar disorder or a family history of bipolar disorder -glaucoma -heart disease -high blood pressure -history of irregular heartbeat -history of low levels of calcium, magnesium, or potassium in the blood -if you often drink alcohol -liver disease -receiving electroconvulsive therapy -seizures -suicidal thoughts, plans, or attempt; a previous suicide attempt by you or a family member -take medicines that treat or prevent blood clots -thyroid disease -an unusual or allergic reaction to sertraline, other medicines, foods, dyes, or preservatives -pregnant or trying to get pregnant -breast-feeding How should I use this medicine? Take this medicine by mouth with a glass of water. Follow the directions on the prescription label. You can take it with or without food. Take your medicine at regular  intervals. Do not take your medicine more often than directed. Do not stop taking this medicine suddenly except upon the advice of your doctor. Stopping this medicine too quickly may cause serious side effects or your condition may worsen. A special MedGuide will be given to you by the pharmacist with each prescription and refill. Be sure to read this information carefully each time. Talk to your pediatrician regarding the use of this medicine in children. While this drug may be prescribed for children as young as 7 years for selected conditions, precautions do apply. Overdosage: If you think you have taken too much of this medicine contact a poison control center or emergency room at once. NOTE: This medicine is only for you. Do not share this medicine with others. What if I miss a dose? If you miss a dose, take it as soon as you can. If it is almost time for your next dose, take only that dose. Do not take double or extra doses. What may interact with this medicine? Do not take this medicine with any of the following medications: -cisapride -dofetilide -dronedarone -linezolid -MAOIs like Carbex, Eldepryl, Marplan, Nardil, and Parnate -methylene blue (injected into a vein) -pimozide -thioridazine This medicine may also interact with the following medications: -alcohol -amphetamines -aspirin and aspirin-like medicines -certain medicines for depression, anxiety, or psychotic disturbances -certain medicines for fungal infections like ketoconazole, fluconazole, posaconazole, and itraconazole -certain medicines for irregular heart beat like flecainide, quinidine, propafenone -certain medicines for migraine headaches like almotriptan, eletriptan, frovatriptan, naratriptan, rizatriptan, sumatriptan, zolmitriptan -certain medicines for sleep -certain medicines for seizures like carbamazepine, valproic acid, phenytoin -certain medicines that treat or prevent blood clots like warfarin, enoxaparin,  dalteparin -cimetidine -digoxin -diuretics -fentanyl -isoniazid -lithium -NSAIDs,  medicines for pain and inflammation, like ibuprofen or naproxen -other medicines that prolong the QT interval (cause an abnormal heart rhythm) -rasagiline -safinamide -supplements like St. John's wort, kava kava, valerian -tolbutamide -tramadol -tryptophan This list may not describe all possible interactions. Give your health care provider a list of all the medicines, herbs, non-prescription drugs, or dietary supplements you use. Also tell them if you smoke, drink alcohol, or use illegal drugs. Some items may interact with your medicine. What should I watch for while using this medicine? Tell your doctor if your symptoms do not get better or if they get worse. Visit your doctor or health care professional for regular checks on your progress. Because it may take several weeks to see the full effects of this medicine, it is important to continue your treatment as prescribed by your doctor. Patients and their families should watch out for new or worsening thoughts of suicide or depression. Also watch out for sudden changes in feelings such as feeling anxious, agitated, panicky, irritable, hostile, aggressive, impulsive, severely restless, overly excited and hyperactive, or not being able to sleep. If this happens, especially at the beginning of treatment or after a change in dose, call your health care professional. Bonita Quin may get drowsy or dizzy. Do not drive, use machinery, or do anything that needs mental alertness until you know how this medicine affects you. Do not stand or sit up quickly, especially if you are an older patient. This reduces the risk of dizzy or fainting spells. Alcohol may interfere with the effect of this medicine. Avoid alcoholic drinks. Your mouth may get dry. Chewing sugarless gum or sucking hard candy, and drinking plenty of water may help. Contact your doctor if the problem does not go away or  is severe. What side effects may I notice from receiving this medicine? Side effects that you should report to your doctor or health care professional as soon as possible: -allergic reactions like skin rash, itching or hives, swelling of the face, lips, or tongue -anxious -black, tarry stools -changes in vision -confusion -elevated mood, decreased need for sleep, racing thoughts, impulsive behavior -eye pain -fast, irregular heartbeat -feeling faint or lightheaded, falls -feeling agitated, angry, or irritable -hallucination, loss of contact with reality -loss of balance or coordination -loss of memory -painful or prolonged erections -restlessness, pacing, inability to keep still -seizures -stiff muscles -suicidal thoughts or other mood changes -trouble sleeping -unusual bleeding or bruising -unusually weak or tired -vomiting Side effects that usually do not require medical attention (report to your doctor or health care professional if they continue or are bothersome): -change in appetite or weight -change in sex drive or performance -diarrhea -increased sweating -indigestion, nausea -tremors This list may not describe all possible side effects. Call your doctor for medical advice about side effects. You may report side effects to FDA at 1-800-FDA-1088. Where should I keep my medicine? Keep out of the reach of children. Store at room temperature between 15 and 30 degrees C (59 and 86 degrees F). Throw away any unused medicine after the expiration date. NOTE: This sheet is a summary. It may not cover all possible information. If you have questions about this medicine, talk to your doctor, pharmacist, or health care provider.  2018 Elsevier/Gold Standard (2016-11-29 14:17:49)   Insomnia Insomnia is a sleep disorder that makes it difficult to fall asleep or to stay asleep. Insomnia can cause tiredness (fatigue), low energy, difficulty concentrating, mood swings, and poor  performance at work or school. There  are three different ways to classify insomnia:  Difficulty falling asleep.  Difficulty staying asleep.  Waking up too early in the morning.  Any type of insomnia can be long-term (chronic) or short-term (acute). Both are common. Short-term insomnia usually lasts for three months or less. Chronic insomnia occurs at least three times a week for longer than three months. What are the causes? Insomnia may be caused by another condition, situation, or substance, such as:  Anxiety.  Certain medicines.  Gastroesophageal reflux disease (GERD) or other gastrointestinal conditions.  Asthma or other breathing conditions.  Restless legs syndrome, sleep apnea, or other sleep disorders.  Chronic pain.  Menopause. This may include hot flashes.  Stroke.  Abuse of alcohol, tobacco, or illegal drugs.  Depression.  Caffeine.  Neurological disorders, such as Alzheimer disease.  An overactive thyroid (hyperthyroidism).  The cause of insomnia may not be known. What increases the risk? Risk factors for insomnia include:  Gender. Women are more commonly affected than men.  Age. Insomnia is more common as you get older.  Stress. This may involve your professional or personal life.  Income. Insomnia is more common in people with lower income.  Lack of exercise.  Irregular work schedule or night shifts.  Traveling between different time zones.  What are the signs or symptoms? If you have insomnia, trouble falling asleep or trouble staying asleep is the main symptom. This may lead to other symptoms, such as:  Feeling fatigued.  Feeling nervous about going to sleep.  Not feeling rested in the morning.  Having trouble concentrating.  Feeling irritable, anxious, or depressed.  How is this treated? Treatment for insomnia depends on the cause. If your insomnia is caused by an underlying condition, treatment will focus on addressing the  condition. Treatment may also include:  Medicines to help you sleep.  Counseling or therapy.  Lifestyle adjustments.  Follow these instructions at home:  Take medicines only as directed by your health care provider.  Keep regular sleeping and waking hours. Avoid naps.  Keep a sleep diary to help you and your health care provider figure out what could be causing your insomnia. Include: ? When you sleep. ? When you wake up during the night. ? How well you sleep. ? How rested you feel the next day. ? Any side effects of medicines you are taking. ? What you eat and drink.  Make your bedroom a comfortable place where it is easy to fall asleep: ? Put up shades or special blackout curtains to block light from outside. ? Use a white noise machine to block noise. ? Keep the temperature cool.  Exercise regularly as directed by your health care provider. Avoid exercising right before bedtime.  Use relaxation techniques to manage stress. Ask your health care provider to suggest some techniques that may work well for you. These may include: ? Breathing exercises. ? Routines to release muscle tension. ? Visualizing peaceful scenes.  Cut back on alcohol, caffeinated beverages, and cigarettes, especially close to bedtime. These can disrupt your sleep.  Do not overeat or eat spicy foods right before bedtime. This can lead to digestive discomfort that can make it hard for you to sleep.  Limit screen use before bedtime. This includes: ? Watching TV. ? Using your smartphone, tablet, and computer.  Stick to a routine. This can help you fall asleep faster. Try to do a quiet activity, brush your teeth, and go to bed at the same time each night.  Get out of  bed if you are still awake after 15 minutes of trying to sleep. Keep the lights down, but try reading or doing a quiet activity. When you feel sleepy, go back to bed.  Make sure that you drive carefully. Avoid driving if you feel very  sleepy.  Keep all follow-up appointments as directed by your health care provider. This is important. Contact a health care provider if:  You are tired throughout the day or have trouble in your daily routine due to sleepiness.  You continue to have sleep problems or your sleep problems get worse. Get help right away if:  You have serious thoughts about hurting yourself or someone else. This information is not intended to replace advice given to you by your health care provider. Make sure you discuss any questions you have with your health care provider. Document Released: 11/22/2000 Document Revised: 04/26/2016 Document Reviewed: 08/26/2014 Elsevier Interactive Patient Education  [employer]2018 Elsevier Inc.

## 2018-07-25 ENCOUNTER — Encounter: Payer: Self-pay | Admitting: Endocrinology

## 2018-08-11 ENCOUNTER — Encounter: Payer: Self-pay | Admitting: Endocrinology

## 2018-08-11 ENCOUNTER — Ambulatory Visit (INDEPENDENT_AMBULATORY_CARE_PROVIDER_SITE_OTHER): Payer: Medicaid Other | Admitting: Endocrinology

## 2018-08-11 DIAGNOSIS — E0590 Thyrotoxicosis, unspecified without thyrotoxic crisis or storm: Secondary | ICD-10-CM | POA: Diagnosis not present

## 2018-08-11 LAB — T4, FREE: FREE T4: 0.57 ng/dL — AB (ref 0.60–1.60)

## 2018-08-11 LAB — TSH: TSH: 2.25 u[IU]/mL (ref 0.35–4.50)

## 2018-08-11 MED ORDER — ATENOLOL 25 MG PO TABS: 12.5000 mg | ORAL_TABLET | Freq: Two times a day (BID) | ORAL | 11 refills | Status: DC

## 2018-08-11 NOTE — Progress Notes (Signed)
Subjective:    Patient ID: Sheri Brock, female    DOB: 10-03-1986, 32 y.o.   MRN: [id number]  HPI Pt returns for f/u of hyperthyroidism (dx'ed 2017; she has intermittently been on tapazole since 2018; she has never had dedicated thyroid imaging; she chose [facility] rx).  She could not get RAI, due to a gap in her medicaid, but she has regained coverage.  She has slight palpitations in the chest, and assoc sob.  Past Medical History:  Diagnosis Date  . Anxiety   . Thyroid disease     Past Surgical History:  Procedure Laterality Date  . NO PAST SURGERIES      Social History   Socioeconomic History  . Marital status: Single    Spouse name: Not on file  . Number of children: Not on file  . Years of education: Not on file  . Highest education level: Not on file  Occupational History  . Not on file  Social Needs  . Financial resource strain: Not on file  . Food insecurity:    Worry: Not on file    Inability: Not on file  . Transportation needs:    Medical: Not on file    Non-medical: Not on file  Tobacco Use  . Smoking status: Never Smoker  . Smokeless tobacco: Never Used  Substance and Sexual Activity  . Alcohol use: Yes  . Drug use: No  . Sexual activity: Not on file  Lifestyle  . Physical activity:    Days per week: Not on file    Minutes per session: Not on file  . Stress: Not on file  Relationships  . Social connections:    Talks on phone: Not on file    Gets together: Not on file    Attends religious service: Not on file    Active member of club or organization: Not on file    Attends meetings of clubs or organizations: Not on file    Relationship status: Not on file  . Intimate partner violence:    Fear of current or ex partner: Not on file    Emotionally abused: Not on file    Physically abused: Not on file    Forced sexual activity: Not on file  Other Topics Concern  . Not on file  Social History Narrative  . Not on file    Current Outpatient  Medications on File Prior to Visit  Medication Sig Dispense Refill  . ALPRAZolam (XANAX) 0.25 MG tablet Take 1 tablet (0.25 mg total) by mouth 2 (two) times daily as needed for anxiety. 10 tablet 0  . ibuprofen (ADVIL,MOTRIN) 800 MG tablet Take 1 tablet (800 mg total) by mouth 3 (three) times daily. 21 tablet 0   No current facility-administered medications on file prior to visit.     No Known Allergies  Family History  Problem Relation Age of Onset  . Thyroid disease Neg Hx     BP 118/82 (BP Location: Right Arm)   Pulse 62   Ht 5\' 6"  (1.676 m)   Wt 216 lb 3.2 oz (98.1 kg)   LMP 07/17/2018 Comment: pt shielded  SpO2 98%   BMI 34.90 kg/m    Review of Systems Denies fever, but she has tremor.      Objective:   Physical Exam VITAL SIGNS:  See vs page GENERAL: no distress NECK: thyroid is 3-5 times normal size, diffuse.  No thyroid nodule is palpable.       Assessment &  Plan:  Hyperthyroidism, improved on tapazole HTN: overcontrolled on tenormin.  Form the standpoint of the thyroid, she no longer needs this dosage.  Tremor and other sxs.  With these sxs, she may still be hyperthyroid despite the tapazole.   Patient Instructions  Please stop the methimazole pill.  Please reduce the atenolol to 1/2 pill, twice a day.   blood tests are requested for you today.  We'll let you know about the results.  let's check a thyroid "scan" (a special, but easy and painless type of thyroid x ray).   you will receive a phone call, about a day and time for an appointment.  you go to the x-ray department of the hospital to swallow a pill, which contains a miniscule amount of radiation.  You will not notice any symptoms from this.  You will go back to the x-ray department the next day, to lie down in front of a camera.  The results of this will be sent to me.   Based on the results, i hope to order for you a treatment pill of radioactive iodine.  Although it is a larger amount of radiation, you  will again notice no symptoms from this.  The pill is gone from your body in a few days (during which you should stay away from other people), but takes several months to work.  Therefore, please return here approximately 5 days after the treatment.  This treatment has been available for many years, and the only known side-effect is an underactive thyroid.  It is possible that i would eventually prescribe for you a thyroid hormone pill, which is very inexpensive.  You don't have to worry about side-effects of this thyroid hormone pill, because it is the same molecule your thyroid makes.

## 2018-08-11 NOTE — Patient Instructions (Addendum)
Please stop the methimazole pill.  Please reduce the atenolol to 1/2 pill, twice a day.   blood tests are requested for you today.  We'll let you know about the results.  let's check a thyroid "scan" (a special, but easy and painless type of thyroid x ray).   you will receive a phone call, about a day and time for an appointment.  you go to the x-ray department of the hospital to swallow a pill, which contains a miniscule amount of radiation.  You will not notice any symptoms from this.  You will go back to the x-ray department the next day, to lie down in front of a camera.  The results of this will be sent to me.   Based on the results, i hope to order for you a treatment pill of radioactive iodine.  Although it is a larger amount of radiation, you will again notice no symptoms from this.  The pill is gone from your body in a few days (during which you should stay away from other people), but takes several months to work.  Therefore, please return here approximately 5 days after the treatment.  This treatment has been available for many years, and the only known side-effect is an underactive thyroid.  It is possible that i would eventually prescribe for you a thyroid hormone pill, which is very inexpensive.  You don't have to worry about side-effects of this thyroid hormone pill, because it is the same molecule your thyroid makes.

## 2018-08-14 ENCOUNTER — Ambulatory Visit: Payer: Self-pay | Admitting: Family Medicine

## 2018-08-24 ENCOUNTER — Encounter (HOSPITAL_COMMUNITY): Payer: Medicaid Other

## 2018-08-25 ENCOUNTER — Encounter (HOSPITAL_COMMUNITY): Payer: Medicaid Other

## 2018-08-26 ENCOUNTER — Telehealth: Payer: Self-pay

## 2018-08-26 ENCOUNTER — Telehealth: Payer: Self-pay | Admitting: Endocrinology

## 2018-08-26 NOTE — Telephone Encounter (Signed)
This has been scheduled/thx dmf

## 2018-08-26 NOTE — Telephone Encounter (Signed)
Ov tomorrow please.

## 2018-08-26 NOTE — Telephone Encounter (Signed)
Patient called earlier. Patient has new ph# [phone] so that you can reach her

## 2018-08-26 NOTE — Telephone Encounter (Signed)
Please see previous phone note and advise

## 2018-08-26 NOTE — Telephone Encounter (Signed)
Patient calling today she is scheduled the 23rd for thyroid uptake but has some concerns because she is having chest pains and difficulty swallowing patient is wondering if she can be seen soon to discuss-please advise

## 2018-08-27 ENCOUNTER — Encounter: Payer: Self-pay | Admitting: Endocrinology

## 2018-08-27 ENCOUNTER — Telehealth: Payer: Self-pay | Admitting: Endocrinology

## 2018-08-27 ENCOUNTER — Ambulatory Visit (INDEPENDENT_AMBULATORY_CARE_PROVIDER_SITE_OTHER): Payer: Medicaid Other | Admitting: Endocrinology

## 2018-08-27 VITALS — BP 118/82 | HR 57 | Ht 66.0 in | Wt 214.6 lb

## 2018-08-27 DIAGNOSIS — E0590 Thyrotoxicosis, unspecified without thyrotoxic crisis or storm: Secondary | ICD-10-CM

## 2018-08-27 DIAGNOSIS — R079 Chest pain, unspecified: Secondary | ICD-10-CM | POA: Insufficient documentation

## 2018-08-27 LAB — CBC WITH DIFFERENTIAL/PLATELET
Basophils Absolute: 0.1 10*3/uL (ref 0.0–0.1)
Basophils Relative: 1.3 % (ref 0.0–3.0)
EOS ABS: 0.1 10*3/uL (ref 0.0–0.7)
Eosinophils Relative: 0.9 % (ref 0.0–5.0)
HCT: 36.2 % (ref 36.0–46.0)
HEMOGLOBIN: 11.9 g/dL — AB (ref 12.0–15.0)
Lymphocytes Relative: 29.9 % (ref 12.0–46.0)
Lymphs Abs: 2.1 10*3/uL (ref 0.7–4.0)
MCHC: 32.8 g/dL (ref 30.0–36.0)
MCV: 80.9 fl (ref 78.0–100.0)
Monocytes Absolute: 0.4 10*3/uL (ref 0.1–1.0)
Monocytes Relative: 5.2 % (ref 3.0–12.0)
NEUTROS PCT: 62.7 % (ref 43.0–77.0)
Neutro Abs: 4.4 10*3/uL (ref 1.4–7.7)
Platelets: 247 10*3/uL (ref 150.0–400.0)
RBC: 4.47 Mil/uL (ref 3.87–5.11)
RDW: 14.2 % (ref 11.5–15.5)
WBC: 6.9 10*3/uL (ref 4.0–10.5)

## 2018-08-27 LAB — TSH: TSH: 0.02 u[IU]/mL — ABNORMAL LOW (ref 0.35–4.50)

## 2018-08-27 LAB — T4, FREE: FREE T4: 1.12 ng/dL (ref 0.60–1.60)

## 2018-08-27 MED ORDER — METHIMAZOLE 10 MG PO TABS: 10.0000 mg | ORAL_TABLET | Freq: Every day | ORAL | 5 refills | Status: DC

## 2018-08-27 MED ORDER — ATENOLOL 25 MG PO TABS: 12.5000 mg | ORAL_TABLET | Freq: Every day | ORAL | 11 refills | Status: DC

## 2018-08-27 NOTE — Telephone Encounter (Signed)
I see that her results are back but have not been reviewed yet

## 2018-08-27 NOTE — Progress Notes (Signed)
Subjective:    Patient ID: Sheri Brock, female    DOB: 1986/08/07, 32 y.o.   MRN: [id number]  HPI Pt returns for f/u of hyperthyroidism (dx'ed 2017; she has intermittently been on tapazole since 2018; she has never had dedicated thyroid imaging; she chose [facility] rx).  She could not get RAI, due to a gap in her medicaid, but she has regained coverage.  She has 1 week of slight pain in the chest, rad to the LUE, and assoc sore throat.  It is worse with lying supine. She says she cannot be isolated for the RAI.   Past Medical History:  Diagnosis Date  . Anxiety   . Thyroid disease     Past Surgical History:  Procedure Laterality Date  . NO PAST SURGERIES      Social History   Socioeconomic History  . Marital status: Single    Spouse name: Not on file  . Number of children: Not on file  . Years of education: Not on file  . Highest education level: Not on file  Occupational History  . Not on file  Social Needs  . Financial resource strain: Not on file  . Food insecurity:    Worry: Not on file    Inability: Not on file  . Transportation needs:    Medical: Not on file    Non-medical: Not on file  Tobacco Use  . Smoking status: Never Smoker  . Smokeless tobacco: Never Used  Substance and Sexual Activity  . Alcohol use: Yes  . Drug use: No  . Sexual activity: Not on file  Lifestyle  . Physical activity:    Days per week: Not on file    Minutes per session: Not on file  . Stress: Not on file  Relationships  . Social connections:    Talks on phone: Not on file    Gets together: Not on file    Attends religious service: Not on file    Active member of club or organization: Not on file    Attends meetings of clubs or organizations: Not on file    Relationship status: Not on file  . Intimate partner violence:    Fear of current or ex partner: Not on file    Emotionally abused: Not on file    Physically abused: Not on file    Forced sexual activity: Not on file  Other  Topics Concern  . Not on file  Social History Narrative  . Not on file    Current Outpatient Medications on File Prior to Visit  Medication Sig Dispense Refill  . ALPRAZolam (XANAX) 0.25 MG tablet Take 1 tablet (0.25 mg total) by mouth 2 (two) times daily as needed for anxiety. 10 tablet 0  . ibuprofen (ADVIL,MOTRIN) 800 MG tablet Take 1 tablet (800 mg total) by mouth 3 (three) times daily. 21 tablet 0   No current facility-administered medications on file prior to visit.     No Known Allergies  Family History  Problem Relation Age of Onset  . Thyroid disease Neg Hx     BP 118/82   Pulse (!) 57   Ht 5\' 6"  (1.676 m)   Wt 214 lb 9.6 oz (97.3 kg)   SpO2 97%   BMI 34.64 kg/m    Review of Systems She has slight headache, but no fever.      Objective:   Physical Exam VITAL SIGNS:  See vs page GENERAL: no distress LUNGS:  Clear to auscultation  HEART:  Regular rate and rhythm without murmurs noted. Normal S1,S2.     I personally reviewed electrocardiogram tracing (today): Indication: chest pain Impression: NSR.  No MI.  No hypertrophy. Compared to 06/19/18: SB is new     Assessment & Plan:  Chest pain, new, uncertain etiology Bradycardia, new Hyperthyroidism: she declines RAI.   Patient Instructions  Please reduce the atenolol to 1/2 pill, once a day.   blood tests are requested for you today.  We'll let you know about the results.   We are canceling the thyroid scan. Please see your primary care provider if the symptoms persist.   Please come back for a follow-up appointment in 1 month.

## 2018-08-27 NOTE — Telephone Encounter (Signed)
Patient would like to know If her blood work has come back. She stated that Dr Everardo AllEllison would have to wait for her blood work before changing her medication  Please advise

## 2018-08-27 NOTE — Telephone Encounter (Signed)
I sent result message on  my chart

## 2018-08-27 NOTE — Patient Instructions (Addendum)
Please reduce the atenolol to 1/2 pill, once a day.   blood tests are requested for you today.  We'll let you know about the results.   We are canceling the thyroid scan. Please see your primary care provider if the symptoms persist.   Please come back for a follow-up appointment in 1 month.

## 2018-08-31 ENCOUNTER — Encounter (HOSPITAL_COMMUNITY): Payer: Medicaid Other

## 2018-09-01 ENCOUNTER — Encounter (HOSPITAL_COMMUNITY): Payer: Medicaid Other

## 2018-09-25 ENCOUNTER — Encounter: Payer: Self-pay | Admitting: Endocrinology

## 2018-09-25 ENCOUNTER — Ambulatory Visit (INDEPENDENT_AMBULATORY_CARE_PROVIDER_SITE_OTHER): Payer: Medicaid Other | Admitting: Endocrinology

## 2018-09-25 VITALS — BP 140/72 | HR 70 | Ht 66.0 in | Wt 214.0 lb

## 2018-09-25 DIAGNOSIS — E0590 Thyrotoxicosis, unspecified without thyrotoxic crisis or storm: Secondary | ICD-10-CM | POA: Diagnosis not present

## 2018-09-25 LAB — T4, FREE: FREE T4: 0.69 ng/dL (ref 0.60–1.60)

## 2018-09-25 LAB — TSH: TSH: 1.02 u[IU]/mL (ref 0.35–4.50)

## 2018-09-25 NOTE — Progress Notes (Signed)
Subjective:    Patient ID: Sheri Brock, female    DOB: [birth year], 32 y.o.   MRN: [id number]  HPI Pt returns for f/u of hyperthyroidism (dx'ed 2017; she has intermittently been on tapazole since 2018; she has never had dedicated thyroid imaging; she cannot be isolated for the RAI).  She takes tapazole as rx'ed.  pt states she feels well in general.  Past Medical History:  Diagnosis Date  . Anxiety   . Thyroid disease     Past Surgical History:  Procedure Laterality Date  . NO PAST SURGERIES      Social History   Socioeconomic History  . Marital status: Single    Spouse name: Not on file  . Number of children: Not on file  . Years of education: Not on file  . Highest education level: Not on file  Occupational History  . Not on file  Social Needs  . Financial resource strain: Not on file  . Food insecurity:    Worry: Not on file    Inability: Not on file  . Transportation needs:    Medical: Not on file    Non-medical: Not on file  Tobacco Use  . Smoking status: Never Smoker  . Smokeless tobacco: Never Used  Substance and Sexual Activity  . Alcohol use: Yes  . Drug use: No  . Sexual activity: Not on file  Lifestyle  . Physical activity:    Days per week: Not on file    Minutes per session: Not on file  . Stress: Not on file  Relationships  . Social connections:    Talks on phone: Not on file    Gets together: Not on file    Attends religious service: Not on file    Active member of club or organization: Not on file    Attends meetings of clubs or organizations: Not on file    Relationship status: Not on file  . Intimate partner violence:    Fear of current or ex partner: Not on file    Emotionally abused: Not on file    Physically abused: Not on file    Forced sexual activity: Not on file  Other Topics Concern  . Not on file  Social History Narrative  . Not on file    Current Outpatient Medications on File Prior to Visit  Medication Sig Dispense  Refill  . ALPRAZolam (XANAX) 0.25 MG tablet Take 1 tablet (0.25 mg total) by mouth 2 (two) times daily as needed for anxiety. 10 tablet 0  . ibuprofen (ADVIL,MOTRIN) 800 MG tablet Take 1 tablet (800 mg total) by mouth 3 (three) times daily. 21 tablet 0  . methimazole (TAPAZOLE) 10 MG tablet Take 1 tablet (10 mg total) by mouth daily. 30 tablet 5   No current facility-administered medications on file prior to visit.     No Known Allergies  Family History  Problem Relation Age of Onset  . Thyroid disease Neg Hx     BP 140/72 (BP Location: Left Arm, Patient Position: Sitting, Cuff Size: Large)   Pulse 70   Ht 5\' 6"  (1.676 m)   Wt 214 lb (97.1 kg)   SpO2 98%   BMI 34.54 kg/m    Review of Systems Denies fever.      Objective:   Physical Exam VITAL SIGNS:  See vs page.   GENERAL: no distress.   NECK: thyroid is 3-4 times normal size, diffuse.     Lab Results  Component Value  Date   TSH 1.02 09/25/2018   T3TOTAL 312 (H) 03/04/2018   T4TOTAL 10.0 05/28/2018      Assessment & Plan:  HTN: is noted today. Hyperthyroidism: well-controlled.  Please continue the same medication. Domestic situation: she has young child to care for, so she cannot be isolated for RAI  Patient Instructions  Your blood pressure is high today.  Please see your primary care provider soon, to have it rechecked Thyroid blood tests are requested for you today.  We'll let you know about the results.   If ever you have fever while taking methimazole, stop it and call us, even if the reason is obvious, because of the risk of a rare side-effect.  Please come back for a follow-up appointment in 3 months.

## 2018-09-25 NOTE — Patient Instructions (Addendum)
Your blood pressure is high today.  Please see your primary care provider soon, to have it rechecked Thyroid blood tests are requested for you today.  We'll let you know about the results.   If ever you have fever while taking methimazole, stop it and call us, even if the reason is obvious, because of the risk of a rare side-effect.  Please come back for a follow-up appointment in 3 months.

## 2018-10-01 ENCOUNTER — Ambulatory Visit (HOSPITAL_COMMUNITY)
Admission: EM | Admit: 2018-10-01 | Discharge: 2018-10-01 | Disposition: A | Discharge status: Home / Self Care | Payer: Medicaid Other | Attending: Family Medicine | Admitting: Family Medicine

## 2018-10-01 ENCOUNTER — Encounter (HOSPITAL_COMMUNITY): Payer: Self-pay | Admitting: Emergency Medicine

## 2018-10-01 DIAGNOSIS — R0789 Other chest pain: Secondary | ICD-10-CM

## 2018-10-01 MED ORDER — NAPROXEN 500 MG PO TABS: 500.0000 mg | ORAL_TABLET | Freq: Two times a day (BID) | ORAL | 0 refills | Status: DC

## 2018-10-01 MED ORDER — OMEPRAZOLE 20 MG PO CPDR: 20.0000 mg | DELAYED_RELEASE_CAPSULE | Freq: Every day | ORAL | 0 refills | Status: DC

## 2018-10-01 NOTE — Discharge Instructions (Addendum)
Take the naprosyn 2 times a day for the pain and inflammation Take omeprazole 2 times a day for stomach protection Activity as tolerated See your PCP in follow up

## 2018-10-01 NOTE — ED Provider Notes (Signed)
[facility]    CSN: [id number] Arrival date & time: 10/01/18  1043     History   Chief Complaint No chief complaint on file.   HPI Sheri Brock is a 32 y.o. female.   HPI\ Patient is here for chest pain.  She woke up with a burning sensation in her chest.  It is in the left upper chest.  She also had some numbness in her left hand.  She states she has had this off and on before.  No injury.  No heavy lifting.  No overuse.  No history of carpal tunnel.  She has noncardiac chest pain fairly often.  She is had 2 ER visits/urgent care visits for this this year.  EKGs are consistently normal.  Chest x-rays and lab works have consistently been normal.  She has Tenormin to take for an irregular heartbeat.  She only takes it as needed.  She did take one this morning.  There is a history of anxiety.  She states that she is under stress, but does not feel particularly anxious at this time.  She definitely is not having symptoms consistent with her usual panic attacks. Cardiac risk factors: No family history, no hypertension, no diabetes, no high cholesterol, no cigarette smoking  Past Medical History:  Diagnosis Date  . Anxiety   . Thyroid disease     Patient Active Problem List   Diagnosis Date Noted  . Chest pain 08/27/2018  . Hyperthyroidism 03/27/2018  . 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 Second degree uterine prolapse 12/11/2016    Past Surgical History:  Procedure Laterality Date  . NO PAST SURGERIES      OB History    Gravida  2   Para  1   Term  1   Preterm      AB  1   Living  1     SAB      TAB      Ectopic      Multiple      Live Births  1            Home Medications    Prior to Admission medications   Medication Sig Start Date End Date Taking? Authorizing Provider  atenolol (TENORMIN) 25 MG tablet Take 12.5 mg by mouth daily.   Yes [provider]  methimazole (TAPAZOLE) 10 MG tablet Take 1 tablet  (10 mg total) by mouth daily. 08/27/18  Yes Romero Belling, MD  naproxen (NAPROSYN) 500 MG tablet Take 1 tablet (500 mg total) by mouth 2 (two) times daily. 10/01/18   Eustace Moore, MD  omeprazole (PRILOSEC) 20 MG capsule Take 1 capsule (20 mg total) by mouth daily. 10/01/18   Eustace Moore, MD    Family History Family History  Problem Relation Age of Onset  . Thyroid disease Neg Hx     Social History Social History   Tobacco Use  . Smoking status: Never Smoker  . Smokeless tobacco: Never Used  Substance Use Topics  . Alcohol use: Yes  . Drug use: No     Allergies   Patient has no known allergies.   Review of Systems Review of Systems  Constitutional: Negative for chills and fever.  HENT: Negative for ear pain and sore throat.   Eyes: Negative for pain and visual disturbance.  Respiratory: Negative for cough and shortness of breath.   Cardiovascular: Positive for chest pain. Negative for palpitations.  Gastrointestinal: Negative for abdominal pain  and vomiting.  Genitourinary: Negative for dysuria and hematuria.  Musculoskeletal: Negative for arthralgias and back pain.  Skin: Negative for color change and rash.  Neurological: Positive for numbness. Negative for seizures and syncope.  Psychiatric/Behavioral: Negative for sleep disturbance. The patient is not nervous/anxious.   All other systems reviewed and are negative.    Physical Exam Triage Vital Signs ED Triage Vitals  Enc Vitals Group     BP 10/01/18 1058 139/76     Pulse Rate 10/01/18 1058 (!) 53     Resp 10/01/18 1058 16     Temp 10/01/18 1058 98.4 F (36.9 C)     Temp Source 10/01/18 1058 Oral     SpO2 10/01/18 1058 100 %     Weight --      Height --      Head Circumference --      Peak Flow --      Pain Score 10/01/18 1057 6     Pain Loc --      Pain Edu? --      Excl. in GC? --    No data found.  Updated Vital Signs BP 139/76   Pulse (!) 53   Temp 98.4 F (36.9 C) (Oral)   Resp  16   LMP 09/29/2018   SpO2 100%       Physical Exam  Constitutional: She appears well-developed and well-nourished. No distress.  HENT:  Head: Normocephalic and atraumatic.  Mouth/Throat: Oropharynx is clear and moist.  Eyes: Pupils are equal, round, and reactive to light. Conjunctivae are normal.  Neck: Normal range of motion. Neck supple.  Cardiovascular: Normal rate, regular rhythm and normal heart sounds.  No murmur heard. No ectopy  Pulmonary/Chest: Effort normal and breath sounds normal. No respiratory distress. She has no rales.  Abdominal: Soft. Bowel sounds are normal. She exhibits no distension. There is tenderness.  Mild tenderness to deep palpation of epigastrium  Musculoskeletal: Normal range of motion. She exhibits no edema.  Moderate tenderness to palpation of left upper chest wall. phalen and tinel are negative both hands  Neurological: She is alert.  Skin: Skin is warm and dry.  Psychiatric: She has a normal mood and affect. Her behavior is normal.  Mildly anxious     UC Treatments / Results  Labs (all labs ordered are listed, but only abnormal results are displayed) Labs Reviewed - No data to display  EKG EKG is done in the office and compared with prior. Sinus bradycardia.  Sinus arrhythmia.  No acute changes no ST or T wave changes.  Intervals normal   Radiology No results found.  Procedures Procedures (including critical care time)  Medications Ordered in UC Medications - No data to display  Initial Impression / Assessment and Plan / UC Course  I have reviewed the triage vital signs and the nursing notes.  Pertinent labs & imaging results that were available during my care of the patient were reviewed by me and considered in my medical decision making (see chart for details).     Discussed with patient the differential diagnosis of chest pain.  Including musculoskeletal, chest wall, lung, GI, heart,  anxiety.  He has clinical tenderness to  palpation of the chest wall therefore I think chest wall pain from musculoskeletal causes is most likely.  Mild GI tenderness.  We will give her an NSAID for 2 weeks with omeprazole to protect.  Follow-up with her PCP Final Clinical Impressions(s) / UC Diagnoses   Final diagnoses:  Chest wall pain     Discharge Instructions     Take the naprosyn 2 times a day for the pain and inflammation Take omeprazole 2 times a day for stomach protection Activity as tolerated See your PCP in follow up   ED Prescriptions    Medication Sig Dispense Auth. Provider   omeprazole (PRILOSEC) 20 MG capsule Take 1 capsule (20 mg total) by mouth daily. 30 capsule Eustace Moore, MD   naproxen (NAPROSYN) 500 MG tablet Take 1 tablet (500 mg total) by mouth 2 (two) times daily. 30 tablet Eustace Moore, MD     Controlled Substance Prescriptions [place] Controlled Substance Registry consulted? Not Applicable   Eustace Moore, MD 10/01/18 1308

## 2018-10-01 NOTE — ED Triage Notes (Signed)
PT woke up with a burning sensation in her chest. It developed into a dull pain with numbness down left arm. PT takes atenolol for tachycardia but has not been using it lately. PT took a dose this AM.

## 2018-12-20 ENCOUNTER — Emergency Department (HOSPITAL_COMMUNITY)
Admission: EM | Admit: 2018-12-20 | Discharge: 2018-12-20 | Disposition: A | Discharge status: Home / Self Care | Payer: Medicaid Other | Attending: Emergency Medicine | Admitting: Emergency Medicine

## 2018-12-20 ENCOUNTER — Emergency Department (HOSPITAL_COMMUNITY): Payer: Medicaid Other

## 2018-12-20 DIAGNOSIS — G43109 Migraine with aura, not intractable, without status migrainosus: Secondary | ICD-10-CM

## 2018-12-20 DIAGNOSIS — I10 Essential (primary) hypertension: Secondary | ICD-10-CM | POA: Diagnosis not present

## 2018-12-20 DIAGNOSIS — E0590 Thyrotoxicosis, unspecified without thyrotoxic crisis or storm: Secondary | ICD-10-CM | POA: Insufficient documentation

## 2018-12-20 DIAGNOSIS — R200 Anesthesia of skin: Secondary | ICD-10-CM | POA: Diagnosis not present

## 2018-12-20 DIAGNOSIS — Z79899 Other long term (current) drug therapy: Secondary | ICD-10-CM | POA: Insufficient documentation

## 2018-12-20 DIAGNOSIS — R531 Weakness: Secondary | ICD-10-CM | POA: Diagnosis not present

## 2018-12-20 DIAGNOSIS — G43909 Migraine, unspecified, not intractable, without status migrainosus: Secondary | ICD-10-CM | POA: Insufficient documentation

## 2018-12-20 LAB — PROTIME-INR
INR: 1.01
Prothrombin Time: 13.2 seconds (ref 11.4–15.2)

## 2018-12-20 LAB — DIFFERENTIAL
Abs Immature Granulocytes: 0.01 10*3/uL (ref 0.00–0.07)
Basophils Absolute: 0 10*3/uL (ref 0.0–0.1)
Basophils Relative: 1 %
Eosinophils Absolute: 0.1 10*3/uL (ref 0.0–0.5)
Eosinophils Relative: 2 %
Immature Granulocytes: 0 %
Lymphocytes Relative: 46 %
Lymphs Abs: 3.8 10*3/uL (ref 0.7–4.0)
Monocytes Absolute: 0.4 10*3/uL (ref 0.1–1.0)
Monocytes Relative: 5 %
Neutro Abs: 3.9 10*3/uL (ref 1.7–7.7)
Neutrophils Relative %: 46 %

## 2018-12-20 LAB — I-STAT CHEM 8, ED
BUN: 11 mg/dL (ref 6–20)
Calcium, Ion: 1.13 mmol/L — ABNORMAL LOW (ref 1.15–1.40)
Chloride: 103 mmol/L (ref 98–111)
Creatinine, Ser: 0.5 mg/dL (ref 0.44–1.00)
Glucose, Bld: 102 mg/dL — ABNORMAL HIGH (ref 70–99)
HCT: 36 % (ref 36.0–46.0)
Hemoglobin: 12.2 g/dL (ref 12.0–15.0)
Potassium: 3.4 mmol/L — ABNORMAL LOW (ref 3.5–5.1)
Sodium: 140 mmol/L (ref 135–145)
TCO2: 24 mmol/L (ref 22–32)

## 2018-12-20 LAB — I-STAT TROPONIN, ED: Troponin i, poc: 0 ng/mL (ref 0.00–0.08)

## 2018-12-20 LAB — CBC
HEMATOCRIT: 36.9 % (ref 36.0–46.0)
Hemoglobin: 11.4 g/dL — ABNORMAL LOW (ref 12.0–15.0)
MCH: 25.4 pg — ABNORMAL LOW (ref 26.0–34.0)
MCHC: 30.9 g/dL (ref 30.0–36.0)
MCV: 82.4 fL (ref 80.0–100.0)
Platelets: 308 10*3/uL (ref 150–400)
RBC: 4.48 MIL/uL (ref 3.87–5.11)
RDW: 12.3 % (ref 11.5–15.5)
WBC: 8.4 10*3/uL (ref 4.0–10.5)
nRBC: 0 % (ref 0.0–0.2)

## 2018-12-20 LAB — COMPREHENSIVE METABOLIC PANEL
ALK PHOS: 56 U/L (ref 38–126)
ALT: 12 U/L (ref 0–44)
ANION GAP: 8 (ref 5–15)
AST: 16 U/L (ref 15–41)
Albumin: 3.6 g/dL (ref 3.5–5.0)
BUN: 9 mg/dL (ref 6–20)
CALCIUM: 9 mg/dL (ref 8.9–10.3)
CO2: 24 mmol/L (ref 22–32)
Chloride: 105 mmol/L (ref 98–111)
Creatinine, Ser: 0.57 mg/dL (ref 0.44–1.00)
GFR calc Af Amer: >60 mL/min (ref >60)
GFR calc non Af Amer: >60 mL/min (ref >60)
GLUCOSE: 105 mg/dL — AB (ref 70–99)
Potassium: 3.3 mmol/L — ABNORMAL LOW (ref 3.5–5.1)
Sodium: 137 mmol/L (ref 135–145)
Total Bilirubin: 0.3 mg/dL (ref 0.3–1.2)
Total Protein: 7.3 g/dL (ref 6.5–8.1)

## 2018-12-20 LAB — I-STAT BETA HCG BLOOD, ED (MC, WL, AP ONLY): I-stat hCG, quantitative: <5 m[IU]/mL (ref <5)

## 2018-12-20 LAB — ETHANOL: Alcohol, Ethyl (B): <10 mg/dL (ref <10)

## 2018-12-20 LAB — APTT: aPTT: 29 seconds (ref 24–36)

## 2018-12-20 NOTE — ED Provider Notes (Signed)
[facility] EMERGENCY DEPARTMENT Provider Note   CSN: [id number] Arrival date & time: 12/20/18  0010     History   Chief Complaint Chief Complaint  Patient presents with  . Code Stroke    HPI Ally Knodel is a 33 y.o. female.  Patient reports sudden onset of left-sided numbness 30 minutes before arrival in the ER.  Patient reports that she was sitting on her couch watching TV when symptoms began.  She has not had any difficulty with speech.  Patient reports that the numbness was maximal in onset, started to slowly improve and then worsened again.  No history of heart disease or stroke.     Past Medical History:  Diagnosis Date  . Anxiety   . Thyroid disease     Patient Active Problem List   Diagnosis Date Noted  . Chest pain 08/27/2018  . Hyperthyroidism 03/27/2018  . 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 Second degree uterine prolapse 12/11/2016    Past Surgical History:  Procedure Laterality Date  . NO PAST SURGERIES       OB History    Gravida  2   Para  1   Term  1   Preterm      AB  1   Living  1     SAB      TAB      Ectopic      Multiple      Live Births  1            Home Medications    Prior to Admission medications   Medication Sig Start Date End Date Taking? Authorizing Provider  atenolol (TENORMIN) 25 MG tablet Take 12.5 mg by mouth daily.    [provider]  methimazole (TAPAZOLE) 10 MG tablet Take 1 tablet (10 mg total) by mouth daily. 08/27/18   Romero Belling, MD  naproxen (NAPROSYN) 500 MG tablet Take 1 tablet (500 mg total) by mouth 2 (two) times daily. 10/01/18   Eustace Moore, MD  omeprazole (PRILOSEC) 20 MG capsule Take 1 capsule (20 mg total) by mouth daily. 10/01/18   Eustace Moore, MD    Family History Family History  Problem Relation Age of Onset  . Thyroid disease Neg Hx     Social History Social History   Tobacco Use  . Smoking status: Never  Smoker  . Smokeless tobacco: Never Used  Substance Use Topics  . Alcohol use: Yes  . Drug use: No     Allergies   Patient has no known allergies.   Review of Systems Review of Systems  Neurological: Positive for numbness.  All other systems reviewed and are negative.    Physical Exam Updated Vital Signs BP (!) 178/89   Pulse 91   Temp 98.2 F (36.8 C)   Resp 15   Ht 5\' 6"  (1.676 m)   SpO2 100%   BMI 34.54 kg/m   Physical Exam Vitals signs and nursing note reviewed.  Constitutional:      General: She is not in acute distress.    Appearance: Normal appearance. She is well-developed.  HENT:     Head: Normocephalic and atraumatic.     Right Ear: Hearing normal.     Left Ear: Hearing normal.     Nose: Nose normal.  Eyes:     Conjunctiva/sclera: Conjunctivae normal.     Pupils: Pupils are equal, round, and reactive to light.  Neck:  Musculoskeletal: Normal range of motion and neck supple.  Cardiovascular:     Rate and Rhythm: Regular rhythm.     Heart sounds: S1 normal and S2 normal. No murmur. No friction rub. No gallop.   Pulmonary:     Effort: Pulmonary effort is normal. No respiratory distress.     Breath sounds: Normal breath sounds.  Chest:     Chest wall: No tenderness.  Abdominal:     General: Bowel sounds are normal.     Palpations: Abdomen is soft.     Tenderness: There is no abdominal tenderness. There is no guarding or rebound. Negative signs include Murphy's sign and McBurney's sign.     Hernia: No hernia is present.  Musculoskeletal: Normal range of motion.  Skin:    General: Skin is warm and dry.     Findings: No rash.  Neurological:     Mental Status: She is alert and oriented to person, place, and time.     GCS: GCS eye subscore is 4. GCS verbal subscore is 5. GCS motor subscore is 6.     Cranial Nerves: No cranial nerve deficit.     Sensory: No sensory deficit.     Motor: Pronator drift: LLE.     Coordination: Coordination abnormal.       Comments: LLE strength 3+/5 RLE strength 5/5  Psychiatric:        Speech: Speech normal.        Behavior: Behavior normal.        Thought Content: Thought content normal.      ED Treatments / Results  Labs (all labs ordered are listed, but only abnormal results are displayed) Labs Reviewed  CBC - Abnormal; Notable for the following components:      Result Value   Hemoglobin 11.4 (*)    MCH 25.4 (*)    All other components within normal limits  COMPREHENSIVE METABOLIC PANEL - Abnormal; Notable for the following components:   Potassium 3.3 (*)    Glucose, Bld 105 (*)    All other components within normal limits  I-STAT CHEM 8, ED - Abnormal; Notable for the following components:   Potassium 3.4 (*)    Glucose, Bld 102 (*)    Calcium, Ion 1.13 (*)    All other components within normal limits  PROTIME-INR  APTT  DIFFERENTIAL  ETHANOL  RAPID URINE DRUG SCREEN, HOSP PERFORMED  URINALYSIS, ROUTINE W REFLEX MICROSCOPIC  I-STAT TROPONIN, ED  I-STAT BETA HCG BLOOD, ED ([facility], [facility], [facility] ONLY)  I-STAT CHEM 8, ED  I-STAT TROPONIN, ED  I-STAT BETA HCG BLOOD, ED ([facility], [facility], [facility] ONLY)  CBG MONITORING, ED    EKG EKG Interpretation  Date/Time:  Sunday December 20 2018 00:19:19 EST Ventricular Rate:  94 PR Interval:    QRS Duration: 105 QT Interval:  364 QTC Calculation: 456 R Axis:   34 Text Interpretation:  Sinus rhythm Nonspecific T abnormalities, anterior leads No significant change since last tracing Confirmed by Gilda CreasePollina, Hayzel Ruberg J [phone]([id number]) on 12/20/2018 12:26:36 AM   Radiology Dg Chest 2 View  Result Date: 12/20/2018 CLINICAL DATA:  Cough EXAM: CHEST - 2 VIEW COMPARISON:  06/20/2018 FINDINGS: The heart size and mediastinal contours are within normal limits. Both lungs are clear. The visualized skeletal structures are unremarkable. IMPRESSION: No active cardiopulmonary disease. Electronically Signed   By: Jasmine PangKim  Fujinaga M.D.   On: 12/20/2018 01:21   Mr Brain Wo  Contrast  Result Date: 12/20/2018 CLINICAL DATA:  33 y/o F;  left-sided numbness in the hand, leg, and face. EXAM: MRI HEAD WITHOUT CONTRAST TECHNIQUE: Multiplanar, multiecho pulse sequences of the brain and surrounding structures were obtained without intravenous contrast. COMPARISON:  12/20/2018 CT head FINDINGS: Brain: No acute infarction, hemorrhage, hydrocephalus, extra-axial collection or mass lesion. Vascular: Normal flow voids. Skull and upper cervical spine: Normal marrow signal. Sinuses/Orbits: Mild right maxillary sinus mucosal thickening. No abnormal signal of mastoid air cells. Orbits are unremarkable. Other: None. IMPRESSION: No acute intracranial abnormality identified. Unremarkable MRI of the brain. Electronically Signed   By: Mitzi HansenLance  Furusawa-Stratton M.D.   On: 12/20/2018 02:07   Ct Head Code Stroke Wo Contrast  Result Date: 12/20/2018 CLINICAL DATA:  Code stroke.  33 y/o  F; left-sided weakness. EXAM: CT HEAD WITHOUT CONTRAST TECHNIQUE: Contiguous axial images were obtained from the base of the skull through the vertex without intravenous contrast. COMPARISON:  05/28/2018 CT head. FINDINGS: Brain: No evidence of acute infarction, hemorrhage, hydrocephalus, extra-axial collection or mass lesion/mass effect. Vascular: No hyperdense vessel or unexpected calcification. Skull: Normal. Negative for fracture or focal lesion. Sinuses/Orbits: No acute finding. Other: None. ASPECTS [facility]([place] Stroke Program Early CT Score) - Ganglionic level infarction (caudate, lentiform nuclei, internal capsule, insula, M1-M3 cortex): 7 - Supraganglionic infarction (M4-M6 cortex): 3 Total score (0-10 with 10 being normal): 10 IMPRESSION: 1. No acute intracranial abnormality identified. Stable unremarkable CT of the head. 2. ASPECTS is 10. These results were communicated to Dr. Amada JupiterKirkpatrick at 12:37 amon 1/12/2020by text page via the [facility] messaging system. Electronically Signed   By: Mitzi HansenLance  Furusawa-Stratton M.D.   On:  12/20/2018 00:38    Procedures Procedures (including critical care time)  Medications Ordered in ED Medications - No data to display   Initial Impression / Assessment and Plan / ED Course  I have reviewed the triage vital signs and the nursing notes.  Pertinent labs & imaging results that were available during my care of the patient were reviewed by me and considered in my medical decision making (see chart for details).     Patient presents for evaluation of left-sided numbness and weakness.  Patient reports sudden onset while watching TV.  Patient had essentially normal upper extremity strength but did have decreased strength of her left lower extremity.  Exam was somewhat inconsistent over time.  Patient had normal sensation despite stating she had numbness.  She reported improvement.  She was evaluated by neurology and it was felt that this was unlikely to be CVA and TIA.  MRI was recommended.  Recommendation was discharged with follow-up with neurology for likely complicated migraine if normal.  MRI has been performed and is completely normal.  Discussed with patient, she is comfortable with plan.  Final Clinical Impressions(s) / ED Diagnoses   Final diagnoses:  Complicated migraine    ED Discharge Orders    None       Gilda CreasePollina, Galia Rahm J, MD 12/20/18 [id number]

## 2018-12-20 NOTE — Consult Note (Signed)
Neurology Consultation Reason for Consult: Left leg weakness Referring Physician: Pollina, C  CC: Left leg weakness  History is obtained from: Patient  HPI: Sheri Brock is a 33 y.o. female with a history of migraines with visual aura who presents with left sided numbness and left leg weakness that started earlier tonight.  She states that she initially noticed that her left face was numb and tingling in the spread down throughout the left side of her body.  She denies any visual symptoms.  She currently endorses occipital headache, but denies photophobia.  She does have a history of migraines, but has not had one in quite some time.  In the emergency department, she had an NIH of 1 for left leg drift, with improvement in the left-sided numbness.  I discussed with her that my suspicion that this was related to migraine, but would get an MRI to rule out  LKW: 11:45 PM tpa given?: no, mild symptoms    ROS: A 14 point ROS was performed and is negative except as noted in the HPI.   Past Medical History:  Diagnosis Date  . Anxiety   . Thyroid disease      Family History  Problem Relation Age of Onset  . Thyroid disease Neg Hx      Social History:  reports that she has never smoked. She has never used smokeless tobacco. She reports current alcohol use. She reports that she does not use drugs.   Exam: Current vital signs: BP (!) 178/89   Pulse 91   Temp 98.2 F (36.8 C)   Resp 15   Ht 5\' 6"  (1.676 m)   SpO2 100%   BMI 34.54 kg/m  Vital signs in last 24 hours: Temp:  [98.2 F (36.8 C)] 98.2 F (36.8 C) (01/12 0041) Pulse Rate:  [91] 91 (01/12 0020) Resp:  [15] 15 (01/12 0020) BP: (178)/(89) 178/89 (01/12 0022) SpO2:  [100 %] 100 % (01/12 0022)   Physical Exam  Constitutional: Appears well-developed and well-nourished.  Psych: Affect appropriate to situation Eyes: No scleral injection HENT: No OP obstrucion Head: Normocephalic.  Cardiovascular: Normal rate  and regular rhythm.  Respiratory: Effort normal, non-labored breathing GI: Soft.  No distension. There is no tenderness.  Skin: WDI  Neuro: Mental Status: Patient is awake, alert, oriented to person, place, month, year, and situation. Patient is able to give a clear and coherent history. No signs of aphasia or neglect Cranial Nerves: II: Visual Fields are full. Pupils are equal, round, and reactive to light.   III,IV, VI: EOMI without ptosis or diploplia.  V: Facial sensation is symmetric to temperature VII: Facial movement is symmetric.  VIII: hearing is intact to voice X: Uvula elevates symmetrically XI: Shoulder shrug is symmetric. XII: tongue is midline without atrophy or fasciculations.  Motor: Tone is normal. Bulk is normal. 5/5 strength was present in bilateral arms and right leg, and the left leg she has mild drift with 4+/5 strength, questionable effort Sensory: Sensation is symmetric to light touch and temperature in the arms and legs. Cerebellar: FNF and HKS are intact bilaterally   I have reviewed labs in epic and the results pertinent to this consultation are: CMP- mild hypokalemia at 3.3  I have reviewed the images obtained: CT head-unremarkable  Impression: 33 year old female with left-sided numbness and weakness which is mostly resolved.  At this time, I would not favor IV TPA as I think that the risks would outweigh the benefits with her low stroke  scale even if this is a stroke.  My suspicion, however, is that this is more likely complicated migraine, but especially given her blood pressure, further work-up is needed.  Recommendations: 1) MRI brain, if negative, would treat as complicated migraine  Ritta Slot, MD [facility] [phone]  If 7pm- 7am, please page neurology on call as listed in [facility].

## 2018-12-20 NOTE — ED Triage Notes (Signed)
Pt states that 30 minutes ago she began to have L sided numbness in the hand, leg, face that resolved and then returned. Pt has drift in L leg. Pt also having CP that has been going on and off for several days.

## 2018-12-20 NOTE — ED Notes (Signed)
Called Code Stroke

## 2018-12-24 ENCOUNTER — Encounter (HOSPITAL_COMMUNITY): Payer: Medicaid Other

## 2018-12-25 ENCOUNTER — Encounter (HOSPITAL_COMMUNITY): Payer: Medicaid Other

## 2018-12-30 ENCOUNTER — Ambulatory Visit: Payer: Self-pay | Admitting: Endocrinology

## 2019-01-30 ENCOUNTER — Ambulatory Visit (HOSPITAL_COMMUNITY)
Admission: EM | Admit: 2019-01-30 | Discharge: 2019-01-30 | Disposition: A | Discharge status: Home / Self Care | Payer: Medicaid Other | Attending: Family Medicine | Admitting: Family Medicine

## 2019-01-30 ENCOUNTER — Encounter (HOSPITAL_COMMUNITY): Payer: Self-pay | Admitting: Family Medicine

## 2019-01-30 DIAGNOSIS — E0590 Thyrotoxicosis, unspecified without thyrotoxic crisis or storm: Secondary | ICD-10-CM

## 2019-01-30 MED ORDER — ATENOLOL 25 MG PO TABS: 12.5000 mg | ORAL_TABLET | Freq: Every day | ORAL | 3 refills | Status: DC

## 2019-01-30 MED ORDER — METHIMAZOLE 10 MG PO TABS: 10.0000 mg | ORAL_TABLET | Freq: Every day | ORAL | 3 refills | Status: DC

## 2019-01-30 NOTE — Discharge Instructions (Addendum)
Ask Dr. Everardo All about monoclonal antibody treatment for hyperthyroidism

## 2019-01-30 NOTE — ED Provider Notes (Signed)
[facility]    CSN: [id number] Arrival date & time: 01/30/19  1132     History   Chief Complaint Chief Complaint  Patient presents with  . Anxiety    HPI Sheri Brock is a 33 y.o. female.   Is a 33 year old woman with history of anxiety.  She was seen last year and referred for counseling.  She has a history of hyperthyroidism and has been prescribed methimazole to control this.  Her last thyroid functions were normal back in October 2019.  Ran out of medicine 3 weeks ago, anxiety began 1 week ago.  Same symptoms she's had in the past that were well controlled with hyperthyroid meds.  Works two jobs.     Past Medical History:  Diagnosis Date  . Anxiety   . Thyroid disease     Patient Active Problem List   Diagnosis Date Noted  . Chest pain 08/27/2018  . Hyperthyroidism 03/27/2018  . 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 Second degree uterine prolapse 12/11/2016    Past Surgical History:  Procedure Laterality Date  . NO PAST SURGERIES      OB History    Gravida  2   Para  1   Term  1   Preterm      AB  1   Living  1     SAB      TAB      Ectopic      Multiple      Live Births  1            Home Medications    Prior to Admission medications   Medication Sig Start Date End Date Taking? Authorizing Provider  atenolol (TENORMIN) 25 MG tablet Take 0.5 tablets (12.5 mg total) by mouth daily. 01/30/19   Elvina Sidle, MD  methimazole (TAPAZOLE) 10 MG tablet Take 1 tablet (10 mg total) by mouth daily. 01/30/19   Elvina Sidle, MD    Family History Family History  Problem Relation Age of Onset  . Thyroid disease Neg Hx     Social History Social History   Tobacco Use  . Smoking status: Never Smoker  . Smokeless tobacco: Never Used  Substance Use Topics  . Alcohol use: Yes  . Drug use: No     Allergies   Patient has no known allergies.   Review of Systems Review of Systems   Physical  Exam Triage Vital Signs ED Triage Vitals  Enc Vitals Group     BP      Pulse      Resp      Temp      Temp src      SpO2      Weight      Height      Head Circumference      Peak Flow      Pain Score      Pain Loc      Pain Edu?      Excl. in GC?    No data found.  Updated Vital Signs There were no vitals taken for this visit.   Physical Exam Vitals signs and nursing note reviewed.  Constitutional:      Appearance: Normal appearance. She is obese.  HENT:     Head: Normocephalic and atraumatic.     Right Ear: Tympanic membrane normal.     Left Ear: Tympanic membrane normal.     Nose: Nose normal.     Mouth/Throat:  Mouth: Mucous membranes are moist.     Pharynx: Oropharynx is clear.  Eyes:     Conjunctiva/sclera: Conjunctivae normal.     Comments: Mild proptosis bilaterally  Neck:     Musculoskeletal: Normal range of motion and neck supple.     Comments: Diffuse thyroid enlargement without tenderness.  No nodularity Cardiovascular:     Rate and Rhythm: Regular rhythm. Tachycardia present.     Heart sounds: Normal heart sounds.  Pulmonary:     Effort: Pulmonary effort is normal.     Breath sounds: Normal breath sounds.  Musculoskeletal: Normal range of motion.  Skin:    General: Skin is warm and dry.  Neurological:     General: No focal deficit present.     Mental Status: She is alert.  Psychiatric:        Mood and Affect: Mood normal.        Thought Content: Thought content normal.        Judgment: Judgment normal.      UC Treatments / Results  Labs (all labs ordered are listed, but only abnormal results are displayed) Labs Reviewed - No data to display  EKG None  Radiology No results found.  Procedures Procedures (including critical care time)  Medications Ordered in UC Medications - No data to display  Initial Impression / Assessment and Plan / UC Course  I have reviewed the triage vital signs and the nursing notes.  Pertinent  labs & imaging results that were available during my care of the patient were reviewed by me and considered in my medical decision making (see chart for details).    Final Clinical Impressions(s) / UC Diagnoses   Final diagnoses:  Hyperthyroidism     Discharge Instructions     Ask Dr. Everardo All about monoclonal antibody treatment for hyperthyroidism    ED Prescriptions    Medication Sig Dispense Auth. Provider   atenolol (TENORMIN) 25 MG tablet Take 0.5 tablets (12.5 mg total) by mouth daily. 90 tablet Elvina Sidle, MD   methimazole (TAPAZOLE) 10 MG tablet Take 1 tablet (10 mg total) by mouth daily. 90 tablet Elvina Sidle, MD     Controlled Substance Prescriptions  Controlled Substance Registry consulted? Not Applicable   Elvina Sidle, MD 01/30/19 [phone]

## 2019-01-30 NOTE — ED Triage Notes (Signed)
Provider triage  

## 2019-08-04 ENCOUNTER — Other Ambulatory Visit: Payer: Self-pay

## 2019-08-05 ENCOUNTER — Ambulatory Visit: Payer: Medicaid Other | Admitting: Endocrinology

## 2019-08-06 ENCOUNTER — Encounter: Payer: Self-pay | Admitting: Endocrinology

## 2019-08-06 ENCOUNTER — Ambulatory Visit (INDEPENDENT_AMBULATORY_CARE_PROVIDER_SITE_OTHER): Payer: Medicaid Other | Admitting: Endocrinology

## 2019-08-06 ENCOUNTER — Other Ambulatory Visit: Payer: Self-pay

## 2019-08-06 VITALS — BP 102/60 | HR 72 | Ht 66.0 in | Wt 224.6 lb

## 2019-08-06 DIAGNOSIS — E0590 Thyrotoxicosis, unspecified without thyrotoxic crisis or storm: Secondary | ICD-10-CM

## 2019-08-06 LAB — T4, FREE: Free T4: 3.01 ng/dL — ABNORMAL HIGH (ref 0.60–1.60)

## 2019-08-06 LAB — TSH: TSH: <0.01 u[IU]/mL — ABNORMAL LOW (ref 0.35–4.50)

## 2019-08-06 MED ORDER — METHIMAZOLE 10 MG PO TABS: 10.0000 mg | ORAL_TABLET | Freq: Two times a day (BID) | ORAL | 0 refills | Status: DC

## 2019-08-06 NOTE — Progress Notes (Signed)
Subjective:    Patient ID: Sheri Brock, female    DOB: 1986/01/26, 33 y.o.   MRN: [id number]  HPI Pt returns for f/u of hyperthyroidism (dx'ed 2017; she has intermittently been on tapazole since 2018; she has never had dedicated thyroid imaging; she cannot be isolated for the RAI).  She sometimes misses the tapazole.  pt states she feels well in general.  She takes tenormin just PRN.   Past Medical History:  Diagnosis Date  . Anxiety   . Thyroid disease     Past Surgical History:  Procedure Laterality Date  . NO PAST SURGERIES      Social History   Socioeconomic History  . Marital status: Single    Spouse name: Not on file  . Number of children: Not on file  . Years of education: Not on file  . Highest education level: Not on file  Occupational History  . Not on file  Social Needs  . Financial resource strain: Not on file  . Food insecurity    Worry: Not on file    Inability: Not on file  . Transportation needs    Medical: Not on file    Non-medical: Not on file  Tobacco Use  . Smoking status: Never Smoker  . Smokeless tobacco: Never Used  Substance and Sexual Activity  . Alcohol use: Yes  . Drug use: No  . Sexual activity: Not on file  Lifestyle  . Physical activity    Days per week: Not on file    Minutes per session: Not on file  . Stress: Not on file  Relationships  . Social Herbalist on phone: Not on file    Gets together: Not on file    Attends religious service: Not on file    Active member of club or organization: Not on file    Attends meetings of clubs or organizations: Not on file    Relationship status: Not on file  . Intimate partner violence    Fear of current or ex partner: Not on file    Emotionally abused: Not on file    Physically abused: Not on file    Forced sexual activity: Not on file  Other Topics Concern  . Not on file  Social History Narrative  . Not on file    Current Outpatient Medications on File Prior to  Visit  Medication Sig Dispense Refill  . atenolol (TENORMIN) 25 MG tablet Take 0.5 tablets (12.5 mg total) by mouth daily. 90 tablet 3   No current facility-administered medications on file prior to visit.     No Known Allergies  Family History  Problem Relation Age of Onset  . Thyroid disease Neg Hx     BP 102/60 (BP Location: Left Arm, Patient Position: Sitting, Cuff Size: Large)   Pulse 72   Ht 5\' 6"  (1.676 m)   Wt 224 lb 9.6 oz (101.9 kg)   SpO2 98%   BMI 36.25 kg/m    Review of Systems Denies fever.      Objective:   Physical Exam VITAL SIGNS:  See vs page GENERAL: no distress NECK: thyroid is 5 times normal size, (R>L).  No thyroid nodule is palpable.  No palpable lymphadenopathy at the anterior neck.   Lab Results  Component Value Date   TSH <0.01 Repeated and verified X2. (L) 08/06/2019   T3TOTAL 312 (H) 03/04/2018   T4TOTAL 10.0 05/28/2018      Assessment & Plan:  Hyperthyroidism: worse: increase tapazole.  Patient Instructions  Thyroid blood tests are requested for you today.  We'll let you know about the results.   If ever you have fever while taking methimazole, stop it and call us, even if the reason is obvious, because of the risk of a rare side-effect.  It is best to never miss the medication.  However, if you do miss it, next best is to double up the next time.   Please come back for a follow-up appointment in 3 months.

## 2019-08-06 NOTE — Patient Instructions (Addendum)
Thyroid blood tests are requested for you today.  We'll let you know about the results.   If ever you have fever while taking methimazole, stop it and call us, even if the reason is obvious, because of the risk of a rare side-effect.  It is best to never miss the medication.  However, if you do miss it, next best is to double up the next time.   Please come back for a follow-up appointment in 3 months.

## 2019-08-18 ENCOUNTER — Encounter (HOSPITAL_COMMUNITY): Payer: Self-pay

## 2019-08-18 ENCOUNTER — Encounter (HOSPITAL_COMMUNITY): Payer: Self-pay | Admitting: *Deleted

## 2020-01-11 ENCOUNTER — Ambulatory Visit: Payer: Medicaid Other | Admitting: Endocrinology

## 2020-01-11 ENCOUNTER — Encounter: Payer: Self-pay | Admitting: Endocrinology

## 2020-01-11 ENCOUNTER — Ambulatory Visit (INDEPENDENT_AMBULATORY_CARE_PROVIDER_SITE_OTHER): Payer: Medicaid Other | Admitting: Endocrinology

## 2020-01-11 ENCOUNTER — Other Ambulatory Visit: Payer: Self-pay

## 2020-01-11 DIAGNOSIS — E0590 Thyrotoxicosis, unspecified without thyrotoxic crisis or storm: Secondary | ICD-10-CM | POA: Diagnosis not present

## 2020-01-11 MED ORDER — METHIMAZOLE 10 MG PO TABS: 40.0000 mg | ORAL_TABLET | Freq: Every day | ORAL | 0 refills | Status: DC

## 2020-01-11 NOTE — Patient Instructions (Signed)
I have sent a prescription to your pharmacy, to change the methimazole to 4 pills each morning. If ever you have fever while taking methimazole, stop it and call us, even if the reason is obvious, because of the risk of a rare side-effect.   It is best to never miss the medication.  However, if you do miss it, next best is to double up the next time.   Please come back for a follow-up appointment in 1 month.

## 2020-01-11 NOTE — Progress Notes (Signed)
Subjective:    Patient ID: Sheri Brock, female    DOB: [birth year], 34 y.o.   MRN: [id number]  HPI telehealth visit today via doxy video visit.  Alternatives to telehealth are presented to this patient, and the patient agrees to the telehealth visit.   Pt is advised of the cost of the visit, and agrees to this, also.   Patient is at home, and I am at the office.   Persons attending the telehealth visit: the patient and I.   Pt returns for f/u of hyperthyroidism (dx'ed 2017; she has intermittently been on tapazole since 2018; she has never had dedicated thyroid imaging; she cannot be isolated for the RAI).  pt states she feels well in general.  Pt says she sometimes misses the PM dose of tapazole.   Past Medical History:  Diagnosis Date  . Anxiety   . Thyroid disease     Past Surgical History:  Procedure Laterality Date  . NO PAST SURGERIES      Social History   Socioeconomic History  . Marital status: Single    Spouse name: Not on file  . Number of children: Not on file  . Years of education: Not on file  . Highest education level: Not on file  Occupational History  . Not on file  Tobacco Use  . Smoking status: Never Smoker  . Smokeless tobacco: Never Used  Substance and Sexual Activity  . Alcohol use: Yes  . Drug use: No  . Sexual activity: Not on file  Other Topics Concern  . Not on file  Social History Narrative  . Not on file   Social Determinants of Health   Financial Resource Strain:   . Difficulty of Paying Living Expenses: Not on file  Food Insecurity:   . Worried About Programme researcher, broadcasting/film/video in the Last Year: Not on file  . Ran Out of Food in the Last Year: Not on file  Transportation Needs:   . Lack of Transportation (Medical): Not on file  . Lack of Transportation (Non-Medical): Not on file  Physical Activity:   . Days of Exercise per Week: Not on file  . Minutes of Exercise per Session: Not on file  Stress:   . Feeling of Stress : Not on file    Social Connections:   . Frequency of Communication with Friends and Family: Not on file  . Frequency of Social Gatherings with Friends and Family: Not on file  . Attends Religious Services: Not on file  . Active Member of Clubs or Organizations: Not on file  . Attends Banker Meetings: Not on file  . Marital Status: Not on file  Intimate Partner Violence:   . Fear of Current or Ex-Partner: Not on file  . Emotionally Abused: Not on file  . Physically Abused: Not on file  . Sexually Abused: Not on file    Current Outpatient Medications on File Prior to Visit  Medication Sig Dispense Refill  . atenolol (TENORMIN) 25 MG tablet Take 0.5 tablets (12.5 mg total) by mouth daily. 90 tablet 3   No current facility-administered medications on file prior to visit.    No Known Allergies  Family History  Problem Relation Age of Onset  . Thyroid disease Neg Hx     There were no vitals taken for this visit.  Review of Systems Denies fever.      Objective:   Physical Exam   Lab Results  Component Value Date  TSH <0.01 Repeated and verified X2. (L) 08/06/2019   T3TOTAL 312 (H) 03/04/2018   T4TOTAL 10.0 05/28/2018       Assessment & Plan:  Hyperthyroidism: uncertain control.  She declines to check TFT now.   Noncompliance with methimazole.  We'll change to qd rx  Patient Instructions  I have sent a prescription to your pharmacy, to change the methimazole to 4 pills each morning. If ever you have fever while taking methimazole, stop it and call us, even if the reason is obvious, because of the risk of a rare side-effect.   It is best to never miss the medication.  However, if you do miss it, next best is to double up the next time.   Please come back for a follow-up appointment in 1 month.

## 2020-02-17 ENCOUNTER — Ambulatory Visit: Payer: Medicaid Other | Admitting: Endocrinology

## 2020-02-18 ENCOUNTER — Ambulatory Visit (INDEPENDENT_AMBULATORY_CARE_PROVIDER_SITE_OTHER): Payer: Medicaid Other | Admitting: Endocrinology

## 2020-02-18 ENCOUNTER — Encounter: Payer: Self-pay | Admitting: Endocrinology

## 2020-02-18 ENCOUNTER — Other Ambulatory Visit: Payer: Self-pay

## 2020-02-18 VITALS — BP 124/80 | HR 84 | Ht 66.0 in | Wt 238.6 lb

## 2020-02-18 DIAGNOSIS — E0590 Thyrotoxicosis, unspecified without thyrotoxic crisis or storm: Secondary | ICD-10-CM | POA: Diagnosis not present

## 2020-02-18 LAB — T4, FREE: Free T4: 1.03 ng/dL (ref 0.60–1.60)

## 2020-02-18 LAB — TSH: TSH: <0.01 u[IU]/mL — ABNORMAL LOW (ref 0.35–4.50)

## 2020-02-18 MED ORDER — METHIMAZOLE 10 MG PO TABS: 20.0000 mg | ORAL_TABLET | Freq: Every day | ORAL | 0 refills | Status: DC

## 2020-02-18 NOTE — Progress Notes (Signed)
Subjective:    Patient ID: Sheri Brock, female    DOB: 05/13/1986, 34 y.o.   MRN: [id number]  HPI Pt returns for f/u of hyperthyroidism (dx'ed 2017; she has intermittently been on tapazole since 2018; she has never had dedicated thyroid imaging; she cannot be isolated for the RAI; tapazole was changed to QAM, as she was missing the PM dose).  pt says she takes as rx'ed.  pt states she feels well in general.   Past Medical History:  Diagnosis Date  . Anxiety   . Thyroid disease     Past Surgical History:  Procedure Laterality Date  . NO PAST SURGERIES      Social History   Socioeconomic History  . Marital status: Single    Spouse name: Not on file  . Number of children: Not on file  . Years of education: Not on file  . Highest education level: Not on file  Occupational History  . Not on file  Tobacco Use  . Smoking status: Never Smoker  . Smokeless tobacco: Never Used  Substance and Sexual Activity  . Alcohol use: Yes  . Drug use: No  . Sexual activity: Not on file  Other Topics Concern  . Not on file  Social History Narrative  . Not on file   Social Determinants of Health   Financial Resource Strain:   . Difficulty of Paying Living Expenses:   Food Insecurity:   . Worried About Charity fundraiser in the Last Year:   . Arboriculturist in the Last Year:   Transportation Needs:   . Film/video editor (Medical):   Marland Kitchen Lack of Transportation (Non-Medical):   Physical Activity:   . Days of Exercise per Week:   . Minutes of Exercise per Session:   Stress:   . Feeling of Stress :   Social Connections:   . Frequency of Communication with Friends and Family:   . Frequency of Social Gatherings with Friends and Family:   . Attends Religious Services:   . Active Member of Clubs or Organizations:   . Attends Archivist Meetings:   Marland Kitchen Marital Status:   Intimate Partner Violence:   . Fear of Current or Ex-Partner:   . Emotionally Abused:   Marland Kitchen  Physically Abused:   . Sexually Abused:     Current Outpatient Medications on File Prior to Visit  Medication Sig Dispense Refill  . atenolol (TENORMIN) 25 MG tablet Take 0.5 tablets (12.5 mg total) by mouth daily. 90 tablet 3   No current facility-administered medications on file prior to visit.    No Known Allergies  Family History  Problem Relation Age of Onset  . Thyroid disease Neg Hx     BP 124/80   Pulse 84   Ht 5\' 6"  (1.676 m)   Wt 238 lb 9.6 oz (108.2 kg)   SpO2 97%   BMI 38.51 kg/m   Review of Systems Denies fever    Objective:   Physical Exam VITAL SIGNS:  See vs page GENERAL: no distress NECK: thyroid is 3 times normal size, diffuse.     Lab Results  Component Value Date   TSH <0.01 (L) 02/18/2020   T3TOTAL 312 (H) 03/04/2018   T4TOTAL 10.0 05/28/2018      Assessment & Plan:  MNG: clinically stable. Hyperthyroidism: improved.  Reduce tapazole to 20/d  Patient Instructions  Blood tests are requested for you today.  We'll let you know about the results.  If ever you have fever while taking methimazole, stop it and call us, even if the reason is obvious, because of the risk of a rare side-effect.   It is best to never miss the medication.  However, if you do miss it, next best is to double up the next time.   Please come back for a follow-up appointment in 1 month.

## 2020-02-18 NOTE — Patient Instructions (Signed)
Blood tests are requested for you today.  We'll let you know about the results.  If ever you have fever while taking methimazole, stop it and call us, even if the reason is obvious, because of the risk of a rare side-effect. It is best to never miss the medication.  However, if you do miss it, next best is to double up the next time.   Please come back for a follow-up appointment in 1 month.   

## 2020-03-21 ENCOUNTER — Telehealth (INDEPENDENT_AMBULATORY_CARE_PROVIDER_SITE_OTHER): Payer: Medicaid Other | Admitting: Endocrinology

## 2020-03-21 ENCOUNTER — Other Ambulatory Visit: Payer: Self-pay

## 2020-03-21 DIAGNOSIS — E0590 Thyrotoxicosis, unspecified without thyrotoxic crisis or storm: Secondary | ICD-10-CM

## 2020-03-21 NOTE — Patient Instructions (Signed)
Blood tests are requested for you today.  We'll let you know about the results.  If ever you have fever while taking methimazole, stop it and call us, even if the reason is obvious, because of the risk of a rare side-effect. It is best to never miss the medication.  However, if you do miss it, next best is to double up the next time.   Please come back for a follow-up appointment in 1 month.   

## 2020-03-21 NOTE — Progress Notes (Addendum)
Subjective:    Patient ID: Sheri Brock, female    DOB: [birth year], 34 y.o.   MRN: [id number]  HPI  telehealth visit today via phone x 5 minutes Alternatives to telehealth are presented to this patient, and the patient agrees to the telehealth visit. Pt is advised of the cost of the visit, and agrees to this, also.   Patient is at home, and I am at home.   Persons attending the telehealth visit: the patient and I Pt returns for f/u of hyperthyroidism (dx'ed 2017; she has intermittently been on tapazole since 2018; she has never had dedicated thyroid imaging; she cannot be isolated for the RAI; tapazole was changed to QAM, as she was missing the PM dose).  pt says she takes as rx'ed.  pt states she feels well in general.  Specifically, she denies palpitations and tremor.   Past Medical History:  Diagnosis Date  . Anxiety   . Thyroid disease     Past Surgical History:  Procedure Laterality Date  . NO PAST SURGERIES      Social History   Socioeconomic History  . Marital status: Single    Spouse name: Not on file  . Number of children: Not on file  . Years of education: Not on file  . Highest education level: Not on file  Occupational History  . Not on file  Tobacco Use  . Smoking status: Never Smoker  . Smokeless tobacco: Never Used  Substance and Sexual Activity  . Alcohol use: Yes  . Drug use: No  . Sexual activity: Not on file  Other Topics Concern  . Not on file  Social History Narrative  . Not on file   Social Determinants of Health   Financial Resource Strain:   . Difficulty of Paying Living Expenses:   Food Insecurity:   . Worried About Programme researcher, broadcasting/film/video in the Last Year:   . Barista in the Last Year:   Transportation Needs:   . Freight forwarder (Medical):   Marland Kitchen Lack of Transportation (Non-Medical):   Physical Activity:   . Days of Exercise per Week:   . Minutes of Exercise per Session:   Stress:   . Feeling of Stress :   Social  Connections:   . Frequency of Communication with Friends and Family:   . Frequency of Social Gatherings with Friends and Family:   . Attends Religious Services:   . Active Member of Clubs or Organizations:   . Attends Banker Meetings:   Marland Kitchen Marital Status:   Intimate Partner Violence:   . Fear of Current or Ex-Partner:   . Emotionally Abused:   Marland Kitchen Physically Abused:   . Sexually Abused:     Current Outpatient Medications on File Prior to Visit  Medication Sig Dispense Refill  . atenolol (TENORMIN) 25 MG tablet Take 0.5 tablets (12.5 mg total) by mouth daily. 90 tablet 3  . methimazole (TAPAZOLE) 10 MG tablet Take 2 tablets (20 mg total) by mouth daily. 180 tablet 0   No current facility-administered medications on file prior to visit.    No Known Allergies  Family History  Problem Relation Age of Onset  . Thyroid disease Neg Hx     There were no vitals taken for this visit.   Review of Systems Denies fever    Objective:   Physical Exam      Assessment & Plan:  Hyperthyroidism, due for recheck.   Patient Instructions  Blood tests are requested for you today.  We'll let you know about the results.  If ever you have fever while taking methimazole, stop it and call us, even if the reason is obvious, because of the risk of a rare side-effect.   It is best to never miss the medication.  However, if you do miss it, next best is to double up the next time.   Please come back for a follow-up appointment in 1 month.

## 2020-03-27 ENCOUNTER — Other Ambulatory Visit: Payer: Medicaid Other

## 2020-05-01 ENCOUNTER — Telehealth: Payer: Self-pay | Admitting: Endocrinology

## 2020-05-01 NOTE — Telephone Encounter (Signed)
F/u OV is due

## 2020-05-01 NOTE — Telephone Encounter (Signed)
Called pt at Dr. George Hugh request. Scheduled as follows:  Next Appt With Internal Medicine Romero Belling, MD) 05/05/2020 at 8:15 AM

## 2020-05-01 NOTE — Telephone Encounter (Signed)
Not sure if this is something that you would address or should be forwarded to PCP. Please advise

## 2020-05-01 NOTE — Telephone Encounter (Signed)
Patient called stating shes been having really bad panic attacks and would like Dr to give her a call. [phone]

## 2020-05-05 ENCOUNTER — Ambulatory Visit (INDEPENDENT_AMBULATORY_CARE_PROVIDER_SITE_OTHER): Payer: Medicaid Other | Admitting: Endocrinology

## 2020-05-05 ENCOUNTER — Encounter: Payer: Self-pay | Admitting: Endocrinology

## 2020-05-05 ENCOUNTER — Other Ambulatory Visit: Payer: Self-pay

## 2020-05-05 DIAGNOSIS — E0590 Thyrotoxicosis, unspecified without thyrotoxic crisis or storm: Secondary | ICD-10-CM

## 2020-05-05 DIAGNOSIS — E0500 Thyrotoxicosis with diffuse goiter without thyrotoxic crisis or storm: Secondary | ICD-10-CM | POA: Diagnosis not present

## 2020-05-05 LAB — T4, FREE: Free T4: 0.75 ng/dL (ref 0.60–1.60)

## 2020-05-05 LAB — TSH: TSH: 0.05 u[IU]/mL — ABNORMAL LOW (ref 0.35–4.50)

## 2020-05-05 NOTE — Patient Instructions (Addendum)
Your blood pressure is high today.  Please see your primary care provider soon, to have it rechecked Blood tests are requested for you today.  We'll let you know about the results.  If ever you have fever while taking methimazole, stop it and call us, even if the reason is obvious, because of the risk of a rare side-effect.   It is best to never miss the medication.  However, if you do miss it, next best is to double up the next time.   Please see a eye specialist.  you will receive a phone call, about a day and time for an appointment.   Please come back for a follow-up appointment in 6 weeks.

## 2020-05-05 NOTE — Progress Notes (Signed)
Subjective:    Patient ID: Sheri Brock, female    DOB: Nov 29, 1986, 34 y.o.   MRN: [id number]  HPI Pt returns for f/u of hyperthyroidism (dx'ed 2017; she has intermittently been on tapazole since 2018; she has never had dedicated thyroid imaging; she cannot be isolated for the RAI; tapazole was changed to QAM, as she was missing the PM dose).  pt says she takes as rx'ed.  pt states she feels well in general, except for anxiety.  Specifically, she denies palpitations and tremor.  Past Medical History:  Diagnosis Date  . Anxiety   . Thyroid disease     Past Surgical History:  Procedure Laterality Date  . NO PAST SURGERIES      Social History   Socioeconomic History  . Marital status: Single    Spouse name: Not on file  . Number of children: Not on file  . Years of education: Not on file  . Highest education level: Not on file  Occupational History  . Not on file  Tobacco Use  . Smoking status: Never Smoker  . Smokeless tobacco: Never Used  Substance and Sexual Activity  . Alcohol use: Yes  . Drug use: No  . Sexual activity: Not on file  Other Topics Concern  . Not on file  Social History Narrative  . Not on file   Social Determinants of Health   Financial Resource Strain:   . Difficulty of Paying Living Expenses:   Food Insecurity:   . Worried About Programme researcher, broadcasting/film/video in the Last Year:   . Barista in the Last Year:   Transportation Needs:   . Freight forwarder (Medical):   Marland Kitchen Lack of Transportation (Non-Medical):   Physical Activity:   . Days of Exercise per Week:   . Minutes of Exercise per Session:   Stress:   . Feeling of Stress :   Social Connections:   . Frequency of Communication with Friends and Family:   . Frequency of Social Gatherings with Friends and Family:   . Attends Religious Services:   . Active Member of Clubs or Organizations:   . Attends Banker Meetings:   Marland Kitchen Marital Status:   Intimate Partner Violence:   .  Fear of Current or Ex-Partner:   . Emotionally Abused:   Marland Kitchen Physically Abused:   . Sexually Abused:     Current Outpatient Medications on File Prior to Visit  Medication Sig Dispense Refill  . atenolol (TENORMIN) 25 MG tablet Take 0.5 tablets (12.5 mg total) by mouth daily. 90 tablet 3  . methimazole (TAPAZOLE) 10 MG tablet Take 2 tablets (20 mg total) by mouth daily. 180 tablet 0   No current facility-administered medications on file prior to visit.    No Known Allergies  Family History  Problem Relation Age of Onset  . Thyroid disease Neg Hx     BP (!) 148/98   Pulse 74   Ht 5\' 6"  (1.676 m)   Wt 242 lb (109.8 kg)   SpO2 98%   BMI 39.06 kg/m    Review of Systems Denies fever.      Objective:   Physical Exam VITAL SIGNS:  See vs page GENERAL: no distress EYES: mod bilat proptosis NECK: thyroid is 10x normal size (R>L), but no palpable nodule  Lab Results  Component Value Date   TSH 0.05 (L) 05/05/2020   T3TOTAL 312 (H) 03/04/2018   T4TOTAL 10.0 05/28/2018  Assessment & Plan:  HTN: is noted today Hyperthyroidism, improved.  Please continue the same medication Please come back for a follow-up appointment in 6 weeks.

## 2020-06-15 ENCOUNTER — Encounter: Payer: Self-pay | Admitting: Endocrinology

## 2020-06-15 ENCOUNTER — Ambulatory Visit: Payer: Medicaid Other | Admitting: Endocrinology

## 2020-06-15 ENCOUNTER — Other Ambulatory Visit: Payer: Self-pay

## 2020-06-15 VITALS — BP 142/88 | HR 83 | Ht 66.0 in | Wt 242.2 lb

## 2020-06-15 DIAGNOSIS — E0590 Thyrotoxicosis, unspecified without thyrotoxic crisis or storm: Secondary | ICD-10-CM | POA: Diagnosis not present

## 2020-06-15 LAB — T4, FREE: Free T4: 1.24 ng/dL (ref 0.60–1.60)

## 2020-06-15 LAB — TSH: TSH: <0.01 u[IU]/mL — ABNORMAL LOW (ref 0.35–4.50)

## 2020-06-15 MED ORDER — METHIMAZOLE 10 MG PO TABS: 20.0000 mg | ORAL_TABLET | Freq: Two times a day (BID) | ORAL | 1 refills | Status: DC

## 2020-06-15 NOTE — Progress Notes (Signed)
Subjective:    Patient ID: Sheri Brock, female    DOB: [birth year], 34 y.o.   MRN: [id number]  HPI Pt returns for f/u of hyperthyroidism (dx'ed 2017; she has intermittently been on tapazole since 2018; she has never had dedicated thyroid imaging; she cannot be isolated for the RAI; tapazole was changed to QAM, as she was missing the PM dose; pt says she is not at risk for pregnancy).  pt says she takes as rx'ed.  pt states she feels well in general, except for anxiety.  Specifically, she denies palpitations and tremor.  Past Medical History:  Diagnosis Date  . Anxiety   . Thyroid disease     Past Surgical History:  Procedure Laterality Date  . NO PAST SURGERIES      Social History   Socioeconomic History  . Marital status: Single    Spouse name: Not on file  . Number of children: Not on file  . Years of education: Not on file  . Highest education level: Not on file  Occupational History  . Not on file  Tobacco Use  . Smoking status: Never Smoker  . Smokeless tobacco: Never Used  Substance and Sexual Activity  . Alcohol use: Yes  . Drug use: No  . Sexual activity: Not on file  Other Topics Concern  . Not on file  Social History Narrative  . Not on file   Social Determinants of Health   Financial Resource Strain:   . Difficulty of Paying Living Expenses:   Food Insecurity:   . Worried About Programme researcher, broadcasting/film/video in the Last Year:   . Barista in the Last Year:   Transportation Needs:   . Freight forwarder (Medical):   Marland Kitchen Lack of Transportation (Non-Medical):   Physical Activity:   . Days of Exercise per Week:   . Minutes of Exercise per Session:   Stress:   . Feeling of Stress :   Social Connections:   . Frequency of Communication with Friends and Family:   . Frequency of Social Gatherings with Friends and Family:   . Attends Religious Services:   . Active Member of Clubs or Organizations:   . Attends Banker Meetings:   Marland Kitchen Marital  Status:   Intimate Partner Violence:   . Fear of Current or Ex-Partner:   . Emotionally Abused:   Marland Kitchen Physically Abused:   . Sexually Abused:     Current Outpatient Medications on File Prior to Visit  Medication Sig Dispense Refill  . atenolol (TENORMIN) 25 MG tablet Take 0.5 tablets (12.5 mg total) by mouth daily. 90 tablet 3   No current facility-administered medications on file prior to visit.    No Known Allergies  Family History  Problem Relation Age of Onset  . Thyroid disease Neg Hx     BP (!) 142/88   Pulse 83   Ht 5\' 6"  (1.676 m)   Wt 242 lb 3.2 oz (109.9 kg)   SpO2 99%   BMI 39.09 kg/m    Review of Systems Denies fever    Objective:   Physical Exam VITAL SIGNS:  See vs page GENERAL: no distress NECK: thyroid is 3 times normal size--diffuse  Lab Results  Component Value Date   TSH <0.01 (L) 06/15/2020   T3TOTAL 312 (H) 03/04/2018   T4TOTAL 10.0 05/28/2018       Assessment & Plan:  HTN: is noted today.  Hyperthyroidism: worse.  I have sent a  prescription to your pharmacy, to increase tapazole.   Patient Instructions  Your blood pressure is high today.  Please see your primary care provider soon, to have it rechecked Blood tests are requested for you today.  We'll let you know about the results.  If ever you have fever while taking methimazole, stop it and call us, even if the reason is obvious, because of the risk of a rare side-effect.   It is best to never miss the medication.  However, if you do miss it, next best is to double up the next time.    Please come back for a follow-up appointment in 2 months.

## 2020-06-15 NOTE — Patient Instructions (Addendum)
Your blood pressure is high today.  Please see your primary care provider soon, to have it rechecked Blood tests are requested for you today.  We'll let you know about the results.  If ever you have fever while taking methimazole, stop it and call us, even if the reason is obvious, because of the risk of a rare side-effect.   It is best to never miss the medication.  However, if you do miss it, next best is to double up the next time.   Please come back for a follow-up appointment in 2 months.   

## 2020-06-16 ENCOUNTER — Ambulatory Visit: Payer: Medicaid Other | Admitting: Endocrinology

## 2020-08-17 ENCOUNTER — Ambulatory Visit: Payer: Medicaid Other | Admitting: Endocrinology

## 2020-08-18 ENCOUNTER — Other Ambulatory Visit: Payer: Self-pay

## 2020-08-18 ENCOUNTER — Ambulatory Visit: Payer: Medicaid Other | Admitting: Endocrinology

## 2020-08-18 ENCOUNTER — Encounter: Payer: Self-pay | Admitting: Endocrinology

## 2020-08-18 ENCOUNTER — Other Ambulatory Visit (INDEPENDENT_AMBULATORY_CARE_PROVIDER_SITE_OTHER): Payer: Medicaid Other

## 2020-08-18 VITALS — BP 138/76 | HR 88 | Ht 66.0 in | Wt 246.6 lb

## 2020-08-18 DIAGNOSIS — E0590 Thyrotoxicosis, unspecified without thyrotoxic crisis or storm: Secondary | ICD-10-CM

## 2020-08-18 LAB — TSH: TSH: 1.28 u[IU]/mL (ref 0.35–4.50)

## 2020-08-18 LAB — T4, FREE: Free T4: 0.55 ng/dL — ABNORMAL LOW (ref 0.60–1.60)

## 2020-08-18 MED ORDER — METHIMAZOLE 10 MG PO TABS
20.0000 mg | ORAL_TABLET | Freq: Every day | ORAL | 1 refills | Status: DC
Start: 2020-08-18 — End: 2021-02-20

## 2020-08-18 MED ORDER — METHIMAZOLE 10 MG PO TABS: 40.0000 mg | ORAL_TABLET | Freq: Every day | ORAL | 1 refills | Status: DC

## 2020-08-18 NOTE — Progress Notes (Signed)
Subjective:    Patient ID: Sheri Brock, female    DOB: June 14, 1986, 34 y.o.   MRN: [id number]  HPI Pt returns for f/u of hyperthyroidism (dx'ed 2017; she has intermittently been on tapazole since 2018; she has never had dedicated thyroid imaging; she cannot be isolated for the RAI; tapazole was changed to QAM, as she was missing the PM dose; pt says she is not at risk for pregnancy).  pt says she takes as rx'ed.  pt states she feels well in general, except for intermitt palpitations.  Past Medical History:  Diagnosis Date  . Anxiety   . Thyroid disease     Past Surgical History:  Procedure Laterality Date  . NO PAST SURGERIES      Social History   Socioeconomic History  . Marital status: Single    Spouse name: Not on file  . Number of children: Not on file  . Years of education: Not on file  . Highest education level: Not on file  Occupational History  . Not on file  Tobacco Use  . Smoking status: Never Smoker  . Smokeless tobacco: Never Used  Substance and Sexual Activity  . Alcohol use: Yes  . Drug use: No  . Sexual activity: Not on file  Other Topics Concern  . Not on file  Social History Narrative  . Not on file   Social Determinants of Health   Financial Resource Strain:   . Difficulty of Paying Living Expenses: Not on file  Food Insecurity:   . Worried About Programme researcher, broadcasting/film/video in the Last Year: Not on file  . Ran Out of Food in the Last Year: Not on file  Transportation Needs:   . Lack of Transportation (Medical): Not on file  . Lack of Transportation (Non-Medical): Not on file  Physical Activity:   . Days of Exercise per Week: Not on file  . Minutes of Exercise per Session: Not on file  Stress:   . Feeling of Stress : Not on file  Social Connections:   . Frequency of Communication with Friends and Family: Not on file  . Frequency of Social Gatherings with Friends and Family: Not on file  . Attends Religious Services: Not on file  . Active Member  of Clubs or Organizations: Not on file  . Attends Banker Meetings: Not on file  . Marital Status: Not on file  Intimate Partner Violence:   . Fear of Current or Ex-Partner: Not on file  . Emotionally Abused: Not on file  . Physically Abused: Not on file  . Sexually Abused: Not on file    Current Outpatient Medications on File Prior to Visit  Medication Sig Dispense Refill  . atenolol (TENORMIN) 25 MG tablet Take 0.5 tablets (12.5 mg total) by mouth daily. 90 tablet 3   No current facility-administered medications on file prior to visit.    No Known Allergies  Family History  Problem Relation Age of Onset  . Thyroid disease Neg Hx     BP 138/76 (BP Location: Right Arm, Patient Position: Sitting, Cuff Size: Large)   Pulse 88   Ht 5\' 6"  (1.676 m)   Wt 246 lb 9.6 oz (111.9 kg)   SpO2 98%   BMI 39.80 kg/m    Review of Systems Denies fever    Objective:   Physical Exam VITAL SIGNS:  See vs page GENERAL: no distress NECK: thyroid is 3 times normal size--diffuse.    Lab Results  Component  Value Date   TSH 1.28 08/18/2020   T3TOTAL 312 (H) 03/04/2018   T4TOTAL 10.0 05/28/2018      Assessment & Plan:  Hyperthyroidism: much better. Please reduce the methimazole to 2 pill per day

## 2020-08-18 NOTE — Patient Instructions (Signed)
Your blood pressure is high today.  Please see your primary care provider soon, to have it rechecked Blood tests are requested for you today.  We'll let you know about the results.  If ever you have fever while taking methimazole, stop it and call us, even if the reason is obvious, because of the risk of a rare side-effect.   It is best to never miss the medication.  However, if you do miss it, next best is to double up the next time.   Please come back for a follow-up appointment in 2 months.   

## 2020-10-18 ENCOUNTER — Ambulatory Visit: Payer: Medicaid Other | Admitting: Endocrinology

## 2020-10-27 ENCOUNTER — Ambulatory Visit: Payer: Medicaid Other | Admitting: Endocrinology

## 2020-10-27 ENCOUNTER — Other Ambulatory Visit: Payer: Self-pay

## 2020-10-27 VITALS — BP 114/82 | HR 80 | Ht 66.0 in | Wt 245.0 lb

## 2020-10-27 DIAGNOSIS — E0590 Thyrotoxicosis, unspecified without thyrotoxic crisis or storm: Secondary | ICD-10-CM

## 2020-10-27 LAB — TSH: TSH: 1.79 u[IU]/mL (ref 0.35–4.50)

## 2020-10-27 LAB — T4, FREE: Free T4: 0.76 ng/dL (ref 0.60–1.60)

## 2020-10-27 NOTE — Progress Notes (Signed)
Subjective:    Patient ID: Sheri Brock, female    DOB: 08-26-1986, 34 y.o.   MRN: [id number]  HPI Pt returns for f/u of hyperthyroidism (dx'ed 2017; she has intermittently been on tapazole since 2018; she has never had dedicated thyroid imaging; she cannot be isolated for the RAI; tapazole was changed to QAM, as she was missing the PM dose; pt says she is not at risk for pregnancy).  pt says she takes as rx'ed.  pt states she feels well in general, except for intermitt palpitations. She no longer takes atenolol.   Past Medical History:  Diagnosis Date  . Anxiety   . Thyroid disease     Past Surgical History:  Procedure Laterality Date  . NO PAST SURGERIES      Social History   Socioeconomic History  . Marital status: Single    Spouse name: Not on file  . Number of children: Not on file  . Years of education: Not on file  . Highest education level: Not on file  Occupational History  . Not on file  Tobacco Use  . Smoking status: Never Smoker  . Smokeless tobacco: Never Used  Substance and Sexual Activity  . Alcohol use: Yes  . Drug use: No  . Sexual activity: Not on file  Other Topics Concern  . Not on file  Social History Narrative  . Not on file   Social Determinants of Health   Financial Resource Strain:   . Difficulty of Paying Living Expenses: Not on file  Food Insecurity:   . Worried About Programme researcher, broadcasting/film/video in the Last Year: Not on file  . Ran Out of Food in the Last Year: Not on file  Transportation Needs:   . Lack of Transportation (Medical): Not on file  . Lack of Transportation (Non-Medical): Not on file  Physical Activity:   . Days of Exercise per Week: Not on file  . Minutes of Exercise per Session: Not on file  Stress:   . Feeling of Stress : Not on file  Social Connections:   . Frequency of Communication with Friends and Family: Not on file  . Frequency of Social Gatherings with Friends and Family: Not on file  . Attends Religious  Services: Not on file  . Active Member of Clubs or Organizations: Not on file  . Attends Banker Meetings: Not on file  . Marital Status: Not on file  Intimate Partner Violence:   . Fear of Current or Ex-Partner: Not on file  . Emotionally Abused: Not on file  . Physically Abused: Not on file  . Sexually Abused: Not on file    Current Outpatient Medications on File Prior to Visit  Medication Sig Dispense Refill  . methimazole (TAPAZOLE) 10 MG tablet Take 2 tablets (20 mg total) by mouth daily. 180 tablet 1   No current facility-administered medications on file prior to visit.    No Known Allergies  Family History  Problem Relation Age of Onset  . Thyroid disease Neg Hx     BP 114/82   Pulse 80   Ht 5\' 6"  (1.676 m)   Wt 245 lb (111.1 kg)   SpO2 97%   BMI 39.54 kg/m    Review of Systems Denies fever.     Objective:   Physical Exam VITAL SIGNS:  See vs page.  GENERAL: no distress.   NECK: thyroid is 3-5 times normal size--diffuse.    Lab Results  Component Value Date  TSH 1.79 10/27/2020   T3TOTAL 312 (H) 03/04/2018   T4TOTAL 10.0 05/28/2018       Assessment & Plan:  Hyperthyroidism: well-controlled: Please continue the same methimazole

## 2020-10-27 NOTE — Patient Instructions (Signed)
Blood tests are requested for you today.  We'll let you know about the results.  If ever you have fever while taking methimazole, stop it and call us, even if the reason is obvious, because of the risk of a rare side-effect. It is best to never miss the medication.  However, if you do miss it, next best is to double up the next time.   Please come back for a follow-up appointment in 2 months.  

## 2020-12-27 ENCOUNTER — Other Ambulatory Visit: Payer: Self-pay

## 2020-12-28 ENCOUNTER — Ambulatory Visit: Payer: Medicaid Other | Admitting: Endocrinology

## 2021-02-06 ENCOUNTER — Ambulatory Visit: Payer: Medicaid Other | Admitting: Endocrinology

## 2021-02-09 ENCOUNTER — Ambulatory Visit (INDEPENDENT_AMBULATORY_CARE_PROVIDER_SITE_OTHER): Payer: Medicaid Other | Admitting: Endocrinology

## 2021-02-09 ENCOUNTER — Other Ambulatory Visit: Payer: Self-pay

## 2021-02-09 VITALS — BP 150/80 | HR 72 | Ht 67.0 in | Wt 248.2 lb

## 2021-02-09 DIAGNOSIS — E0590 Thyrotoxicosis, unspecified without thyrotoxic crisis or storm: Secondary | ICD-10-CM

## 2021-02-09 DIAGNOSIS — F419 Anxiety disorder, unspecified: Secondary | ICD-10-CM | POA: Insufficient documentation

## 2021-02-09 LAB — T4, FREE: Free T4: 0.92 ng/dL (ref 0.60–1.60)

## 2021-02-09 LAB — TSH: TSH: 0.59 u[IU]/mL (ref 0.35–4.50)

## 2021-02-09 NOTE — Progress Notes (Signed)
   Subjective:    Patient ID: Sheri Brock, female    DOB: 25-Nov-1986, 35 y.o.   MRN: [id number]  HPI Pt returns for f/u of hyperthyroidism (dx'ed 2017; she has intermittently been on tapazole since 2018; she has never had dedicated thyroid imaging; she cannot be isolated for the RAI; tapazole was changed to QAM, as she was missing the PM dose; pt says she is not at risk for pregnancy).  pt says she takes as rx'ed.  pt states she feels well in general, except for anxiety. She says this causes her to not be able to work.   Past Medical History:  Diagnosis Date  . Anxiety   . Thyroid disease     Past Surgical History:  Procedure Laterality Date  . NO PAST SURGERIES      Social History   Socioeconomic History  . Marital status: Single    Spouse name: Not on file  . Number of children: Not on file  . Years of education: Not on file  . Highest education level: Not on file  Occupational History  . Not on file  Tobacco Use  . Smoking status: Never Smoker  . Smokeless tobacco: Never Used  Substance and Sexual Activity  . Alcohol use: Yes  . Drug use: No  . Sexual activity: Not on file  Other Topics Concern  . Not on file  Social History Narrative  . Not on file   Social Determinants of Health   Financial Resource Strain: Not on file  Food Insecurity: Not on file  Transportation Needs: Not on file  Physical Activity: Not on file  Stress: Not on file  Social Connections: Not on file  Intimate Partner Violence: Not on file    Current Outpatient Medications on File Prior to Visit  Medication Sig Dispense Refill  . methimazole (TAPAZOLE) 10 MG tablet Take 2 tablets (20 mg total) by mouth daily. 180 tablet 1   No current facility-administered medications on file prior to visit.    No Known Allergies  Family History  Problem Relation Age of Onset  . Thyroid disease Neg Hx     BP (!) 150/80 (BP Location: Right Arm, Patient Position: Sitting, Cuff Size: Large)    Pulse 72   Ht 5\' 7"  (1.702 m)   Wt 248 lb 3.2 oz (112.6 kg)   SpO2 98%   BMI 38.87 kg/m     Review of Systems Denies fever    Objective:   Physical Exam VITAL SIGNS:  See vs page GENERAL: no distress NECK: thyroid is 3-5 times normal size (R>L, but no palpable nodule).     Lab Results  Component Value Date   TSH 0.59 02/09/2021   T3TOTAL 312 (H) 03/04/2018   T4TOTAL 10.0 05/28/2018      Assessment & Plan:  Hyperthyroidism: well-controlled.  Please continue the same methimazole

## 2021-02-09 NOTE — Patient Instructions (Addendum)
Your blood pressure is high today.  Please see your primary care provider soon, to have it rechecked Blood tests are requested for you today.  We'll let you know about the results.    If ever you have fever while taking methimazole, stop it and call us, even if the reason is obvious, because of the risk of a rare side-effect.   It is best to never miss the medication.  However, if you do miss it, next best is to double up the next time.   Please come back for a follow-up appointment in 4 months.   

## 2021-02-20 ENCOUNTER — Telehealth: Payer: Self-pay | Admitting: Endocrinology

## 2021-02-20 ENCOUNTER — Other Ambulatory Visit: Payer: Self-pay

## 2021-02-20 MED ORDER — METHIMAZOLE 10 MG PO TABS: 20.0000 mg | ORAL_TABLET | Freq: Every day | ORAL | 1 refills | Status: DC

## 2021-02-20 NOTE — Telephone Encounter (Signed)
MEDICATION: Methimazole  PHARMACY:  [facility] on [address]  HAS THE PATIENT CONTACTED THEIR PHARMACY?  no  IS THIS A 90 DAY SUPPLY :  yes  IS PATIENT OUT OF MEDICATION: unknown  IF NOT; HOW MUCH IS LEFT:   LAST APPOINTMENT DATE: @3 /03/2021  NEXT APPOINTMENT DATE:@7 /07/2021  DO WE HAVE YOUR PERMISSION TO LEAVE A DETAILED MESSAGE?: yes  OTHER COMMENTS:    **Let patient know to contact pharmacy at the end of the day to make sure medication is ready. **  ** Please notify patient to allow 48-72 hours to process**  **Encourage patient to contact the pharmacy for refills or they can request refills through [facility]**

## 2021-02-21 NOTE — Telephone Encounter (Signed)
Spoke with pt to let her know that her medication will be ready in 1hr at the pharmacy.

## 2021-02-21 NOTE — Telephone Encounter (Signed)
Patient called re: PHARM did not receive RX that was sent for methimazole (TAPAZOLE) 10 MG tablet. It shows No Print. Please resend RX asap (Patient states she is completely out)  for  methimazole (TAPAZOLE) 10 MG tablet to  [id number] [place], [place] - [address] AT [facility] & [address] Phone:  [phone]  Fax:  [phone]

## 2021-02-26 ENCOUNTER — Other Ambulatory Visit: Payer: Self-pay | Admitting: Endocrinology

## 2021-04-22 ENCOUNTER — Emergency Department (HOSPITAL_BASED_OUTPATIENT_CLINIC_OR_DEPARTMENT_OTHER): Payer: Medicaid Other

## 2021-04-22 ENCOUNTER — Emergency Department (HOSPITAL_BASED_OUTPATIENT_CLINIC_OR_DEPARTMENT_OTHER)
Admission: EM | Admit: 2021-04-22 | Discharge: 2021-04-22 | Disposition: A | Discharge status: Home / Self Care | Payer: Medicaid Other | Attending: Emergency Medicine | Admitting: Emergency Medicine

## 2021-04-22 ENCOUNTER — Encounter (HOSPITAL_BASED_OUTPATIENT_CLINIC_OR_DEPARTMENT_OTHER): Payer: Self-pay

## 2021-04-22 ENCOUNTER — Other Ambulatory Visit: Payer: Self-pay

## 2021-04-22 DIAGNOSIS — I10 Essential (primary) hypertension: Secondary | ICD-10-CM | POA: Diagnosis not present

## 2021-04-22 DIAGNOSIS — Z20822 Contact with and (suspected) exposure to covid-19: Secondary | ICD-10-CM | POA: Diagnosis not present

## 2021-04-22 DIAGNOSIS — J069 Acute upper respiratory infection, unspecified: Secondary | ICD-10-CM | POA: Insufficient documentation

## 2021-04-22 DIAGNOSIS — R059 Cough, unspecified: Secondary | ICD-10-CM | POA: Diagnosis not present

## 2021-04-22 DIAGNOSIS — R079 Chest pain, unspecified: Secondary | ICD-10-CM | POA: Diagnosis not present

## 2021-04-22 DIAGNOSIS — R509 Fever, unspecified: Secondary | ICD-10-CM | POA: Diagnosis not present

## 2021-04-22 LAB — SARS CORONAVIRUS 2 (TAT 6-24 HRS): SARS Coronavirus 2: NEGATIVE

## 2021-04-22 MED ORDER — HYDROCOD POLST-CPM POLST ER 10-8 MG/5ML PO SUER: 5.0000 mL | Freq: Two times a day (BID) | ORAL | 0 refills | Status: DC | PRN

## 2021-04-22 MED ORDER — HYDROCOD POLST-CPM POLST ER 10-8 MG/5ML PO SUER
5.0000 mL | Freq: Once | ORAL | Status: AC
Start: 2021-04-22 — End: 2021-04-22
  Administered 2021-04-22: 5 mL via ORAL
  Filled 2021-04-22: qty 5

## 2021-04-22 NOTE — ED Provider Notes (Signed)
[facility] EMERGENCY DEPT Provider Note   CSN: [id number] Arrival date & time: 04/22/21  0012     History Chief Complaint  Patient presents with  . Cough  . Shortness of Breath    Sheri Brock is a 35 y.o. female.  35 yO F with a chief complaint of a cough.  Going on for couple weeks now.  Had a virtual visit with her family doctor and they suggested that she start over-the-counter medications.  She has had no improvement and actually feels like things are getting worse and she is having some left-sided chest pain when she coughs.  Still feels like she is having fevers and chills off and on.  No nausea or vomiting or diarrhea.  No known sick contacts.  Had a negative home COVID test.  The history is provided by the patient.  Cough Associated symptoms: shortness of breath   Associated symptoms: no chest pain, no chills, no fever, no headaches, no myalgias, no rhinorrhea and no wheezing   Shortness of Breath Associated symptoms: cough   Associated symptoms: no chest pain, no fever, no headaches, no vomiting and no wheezing   Illness Severity:  Moderate Onset quality:  Gradual Duration:  2 weeks Timing:  Constant Progression:  Worsening Chronicity:  New Associated symptoms: cough and shortness of breath   Associated symptoms: no chest pain, no congestion, no fever, no headaches, no myalgias, no nausea, no rhinorrhea, no vomiting and no wheezing        Past Medical History:  Diagnosis Date  . Anxiety   . Thyroid disease     Patient Active Problem List   Diagnosis Date Noted  . 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 Chest pain 08/27/2018  . Hyperthyroidism 03/27/2018  . 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 Second degree uterine prolapse 12/11/2016    Past Surgical History:  Procedure Laterality Date  . NO PAST SURGERIES       OB History    Gravida  2   Para  1   Term  1   Preterm      AB  1   Living  1     SAB       IAB      Ectopic      Multiple      Live Births  1           Family History  Problem Relation Age of Onset  . Thyroid disease Neg Hx     Social History   Tobacco Use  . Smoking status: Never Smoker  . Smokeless tobacco: Never Used  Substance Use Topics  . Alcohol use: Yes  . Drug use: No    Home Medications Prior to Admission medications   Medication Sig Start Date End Date Taking? Authorizing Provider  chlorpheniramine-HYDROcodone (TUSSIONEX PENNKINETIC ER) 10-8 MG/5ML SUER Take 5 mLs by mouth every 12 (twelve) hours as needed for cough. 04/22/21  Yes Melene Plan, DO  methimazole (TAPAZOLE) 10 MG tablet TAKE 2 TABLETS(20 MG) BY MOUTH TWICE DAILY 02/27/21   Romero Belling, MD    Allergies    Patient has no known allergies.  Review of Systems   Review of Systems  Constitutional: Negative for chills and fever.  HENT: Negative for congestion and rhinorrhea.   Eyes: Negative for redness and visual disturbance.  Respiratory: Positive for cough and shortness of breath. Negative for wheezing.   Cardiovascular: Negative for chest pain and palpitations.  Gastrointestinal: Negative for nausea and  vomiting.  Genitourinary: Negative for dysuria and urgency.  Musculoskeletal: Negative for arthralgias and myalgias.  Skin: Negative for pallor and wound.  Neurological: Negative for dizziness and headaches.    Physical Exam Updated Vital Signs BP (!) 159/138 (BP Location: Right Arm)   Pulse 84   Temp 99 F (37.2 C) (Oral)   Resp 20   Ht 5\' 7"  (1.702 m)   Wt 104 kg   LMP 03/30/2021   SpO2 100%   BMI 35.91 kg/m   Physical Exam Vitals and nursing note reviewed.  Constitutional:      General: She is not in acute distress.    Appearance: She is well-developed. She is not diaphoretic.  HENT:     Head: Normocephalic and atraumatic.  Eyes:     Pupils: Pupils are equal, round, and reactive to light.  Cardiovascular:     Rate and Rhythm: Normal rate and regular rhythm.      Heart sounds: No murmur heard. No friction rub. No gallop.   Pulmonary:     Effort: Pulmonary effort is normal.     Breath sounds: No wheezing or rales.     Comments: A somewhat persistent cough with transmitted upper airway noises.  No wheezes.  Clear lungs. Abdominal:     General: There is no distension.     Palpations: Abdomen is soft.     Tenderness: There is no abdominal tenderness.  Musculoskeletal:        General: No tenderness.     Cervical back: Normal range of motion and neck supple.  Skin:    General: Skin is warm and dry.  Neurological:     Mental Status: She is alert and oriented to person, place, and time.  Psychiatric:        Behavior: Behavior normal.     ED Results / Procedures / Treatments   Labs (all labs ordered are listed, but only abnormal results are displayed) Labs Reviewed  SARS CORONAVIRUS 2 (TAT 6-24 HRS)    EKG None  Radiology DG Chest Port 1 View  Result Date: 04/22/2021 CLINICAL DATA:  Cough, fever, left-sided chest pain. EXAM: PORTABLE CHEST 1 VIEW COMPARISON:  12/20/2018 FINDINGS: Low lung volumes.The cardiomediastinal contours are normal. The lungs are clear. Pulmonary vasculature is normal. No consolidation, pleural effusion, or pneumothorax. No acute osseous abnormalities are seen. IMPRESSION: No acute chest findings. Electronically Signed   By: 02/18/2019 M.D.   On: 04/22/2021 00:56    Procedures Procedures   Medications Ordered in ED Medications  chlorpheniramine-HYDROcodone (TUSSIONEX) 10-8 MG/5ML suspension 5 mL (5 mLs Oral Given 04/22/21 0036)    ED Course  I have reviewed the triage vital signs and the nursing notes.  Pertinent labs & imaging results that were available during my care of the patient were reviewed by me and considered in my medical decision making (see chart for details).    MDM Rules/Calculators/A&P                          35 yo  F with a chief complaints of a cough.  Worsening over the past  couple weeks.  Clear lung sounds on my exam.  Satting high percent on room air.  Has a persistent cough here, will start on narcotic cough syrup.  PCP follow up.   1:05 AM:  I have discussed the diagnosis/risks/treatment options with the patient and believe the pt to be eligible for discharge home to follow-up with  PCP. We also discussed returning to the ED immediately if new or worsening sx occur. We discussed the sx which are most concerning (e.g., sudden worsening pain, fever, inability to tolerate by mouth) that necessitate immediate return. Medications administered to the patient during their visit and any new prescriptions provided to the patient are listed below.  Medications given during this visit Medications  chlorpheniramine-HYDROcodone (TUSSIONEX) 10-8 MG/5ML suspension 5 mL (5 mLs Oral Given 04/22/21 0036)     The patient appears reasonably screen and/or stabilized for discharge and I doubt any other medical condition or other [facility] requiring further screening, evaluation, or treatment in the ED at this time prior to discharge.   Final Clinical Impression(s) / ED Diagnoses Final diagnoses:  URI with cough and congestion    Rx / DC Orders ED Discharge Orders         Ordered    chlorpheniramine-HYDROcodone (TUSSIONEX PENNKINETIC ER) 10-8 MG/5ML SUER  Every 12 hours PRN        04/22/21 0103           Melene Plan, DO 04/22/21 0105

## 2021-04-22 NOTE — Discharge Instructions (Signed)
Take tylenol 2 pills 4 times a day and motrin 4 pills 3 times a day.  Drink plenty of fluids.  Return for worsening shortness of breath, headache, confusion. Follow up with your family doctor.   Do not drive if you are taking the cough medicine.

## 2021-04-22 NOTE — ED Triage Notes (Signed)
Pt is present to the ED for a hacking, dry cough x 2 weeks that has progressively worsened. Pt also c/o left sided "rib pain" due to the cough and occasional SOB. Pt used OTC cough meds with no relief, as well Zyrtec and Mucinex.

## 2021-04-22 NOTE — ED Notes (Signed)
Pt verbalizes understanding of discharge instructions. Opportunity for questioning and answers were provided. Armand removed by staff, pt discharged from ED to home. Instructed to pick up Rx.  

## 2021-06-15 ENCOUNTER — Other Ambulatory Visit: Payer: Self-pay

## 2021-06-15 ENCOUNTER — Ambulatory Visit (INDEPENDENT_AMBULATORY_CARE_PROVIDER_SITE_OTHER): Payer: Medicaid Other | Admitting: Endocrinology

## 2021-06-15 VITALS — BP 130/90 | HR 88 | Ht 67.0 in | Wt 246.4 lb

## 2021-06-15 DIAGNOSIS — E0590 Thyrotoxicosis, unspecified without thyrotoxic crisis or storm: Secondary | ICD-10-CM

## 2021-06-15 MED ORDER — METHIMAZOLE 10 MG PO TABS: 20.0000 mg | ORAL_TABLET | Freq: Every day | ORAL | 3 refills | Status: DC

## 2021-06-15 NOTE — Patient Instructions (Addendum)
Blood tests are requested for you today.  We'll let you know about the results.  If ever you have fever while taking methimazole, stop it and call us, even if the reason is obvious, because of the risk of a rare side-effect.   It is best to never miss the medication.  However, if you do miss it, next best is to double up the next time.  Please come back for a follow-up appointment in 4 months.    If you choose the Radioactive iodine treatment pill, it works like this: We would first check a thyroid "scan" (a special, but easy and painless type of thyroid x ray).  It works like this: you go to the x-ray department of the hospital to swallow a pill, which contains a miniscule amount of radiation.  You will not notice any symptoms from this.  You will go back to the x-ray department the next day, to lie down in front of a camera.  The results of this will be sent to me.   Based on the results, i hope to order for you a treatment pill of radioactive iodine.  Although it is a larger amount of radiation, you will again notice no symptoms from this.  The pill is gone from your body in a few days (during which you should stay away from other people), but takes several months to work.  Therefore, please return here approximately 6-8 weeks after the treatment.  This treatment has been available for many years, and the only known side-effect is an underactive thyroid.  It is possible that i would eventually prescribe for you a thyroid hormone pill, which is very inexpensive.  You don't have to worry about side-effects of this thyroid hormone pill, because it is the same molecule your thyroid makes.

## 2021-06-15 NOTE — Progress Notes (Signed)
Subjective:    Patient ID: Sheri Brock, female    DOB: Feb 21, 1986, 35 y.o.   MRN: [id number]  HPI Pt returns for f/u of hyperthyroidism (dx'ed 2017; she has intermittently been on tapazole since 2018; she has never had dedicated thyroid imaging; she cannot be isolated for the RAI; tapazole was changed to QAM, as she was missing the PM dose; pt says she is not at risk for pregnancy).  pt says she takes as rx'ed.  pt states she feels well in general, except for palpitations.   Past Medical History:  Diagnosis Date   Anxiety    Thyroid disease     Past Surgical History:  Procedure Laterality Date   NO PAST SURGERIES      Social History   Socioeconomic History   Marital status: Single    Spouse name: Not on file   Number of children: Not on file   Years of education: Not on file   Highest education level: Not on file  Occupational History   Not on file  Tobacco Use   Smoking status: Never   Smokeless tobacco: Never  Substance and Sexual Activity   Alcohol use: Yes   Drug use: No   Sexual activity: Not on file  Other Topics Concern   Not on file  Social History Narrative   Not on file   Social Determinants of Health   Financial Resource Strain: Not on file  Food Insecurity: Not on file  Transportation Needs: Not on file  Physical Activity: Not on file  Stress: Not on file  Social Connections: Not on file  Intimate Partner Violence: Not on file    No current outpatient medications on file prior to visit.   No current facility-administered medications on file prior to visit.    No Known Allergies  Family History  Problem Relation Age of Onset   Thyroid disease Neg Hx     BP 130/90 (BP Location: Right Arm, Patient Position: Sitting, Cuff Size: Large)   Pulse 88   Ht 5\' 7"  (1.702 m)   Wt 246 lb 6.4 oz (111.8 kg)   SpO2 99%   BMI 38.59 kg/m    Review of Systems Denies fever    Objective:   Physical Exam Eyes: bilat proptosis NECK: thyroid is  3-5 times normal size (R>L, but no palpable nodule).   Lab Results  Component Value Date   TSH 0.01 (L) 06/15/2021   T3TOTAL 312 (H) 03/04/2018   T4TOTAL 10.0 05/28/2018       Assessment & Plan:  Hyperthyroidism: uncontrolled.  Increase tapazole to 30/d.    Patient Instructions  Blood tests are requested for you today.  We'll let you know about the results.  If ever you have fever while taking methimazole, stop it and call 05/30/2018, even if the reason is obvious, because of the risk of a rare side-effect.   It is best to never miss the medication.  However, if you do miss it, next best is to double up the next time.  Please come back for a follow-up appointment in 4 months.    If you choose the Radioactive iodine treatment pill, it works like this: We would first check a thyroid "scan" (a special, but easy and painless type of thyroid x ray).  It works like this: you go to the x-ray department of the hospital to swallow a pill, which contains a miniscule amount of radiation.  You will not notice any symptoms from this.  You will go back to the x-ray department the next day, to lie down in front of a camera.  The results of this will be sent to me.   Based on the results, i hope to order for you a treatment pill of radioactive iodine.  Although it is a larger amount of radiation, you will again notice no symptoms from this.  The pill is gone from your body in a few days (during which you should stay away from other people), but takes several months to work.  Therefore, please return here approximately 6-8 weeks after the treatment.  This treatment has been available for many years, and the only known side-effect is an underactive thyroid.  It is possible that i would eventually prescribe for you a thyroid hormone pill, which is very inexpensive.  You don't have to worry about side-effects of this thyroid hormone pill, because it is the same molecule your thyroid makes.

## 2021-06-16 LAB — TSH: TSH: 0.01 mIU/L — ABNORMAL LOW

## 2021-06-16 LAB — T4, FREE: Free T4: 1.2 ng/dL (ref 0.8–1.8)

## 2021-06-16 MED ORDER — METHIMAZOLE 10 MG PO TABS: 30.0000 mg | ORAL_TABLET | Freq: Every day | ORAL | 3 refills | Status: DC

## 2021-10-10 DIAGNOSIS — F411 Generalized anxiety disorder: Secondary | ICD-10-CM | POA: Diagnosis not present

## 2021-10-16 ENCOUNTER — Ambulatory Visit: Payer: Medicaid Other | Admitting: Endocrinology

## 2021-10-22 ENCOUNTER — Emergency Department (HOSPITAL_BASED_OUTPATIENT_CLINIC_OR_DEPARTMENT_OTHER): Payer: Medicaid Other | Admitting: Radiology

## 2021-10-22 ENCOUNTER — Encounter (HOSPITAL_BASED_OUTPATIENT_CLINIC_OR_DEPARTMENT_OTHER): Payer: Self-pay | Admitting: *Deleted

## 2021-10-22 ENCOUNTER — Other Ambulatory Visit: Payer: Self-pay

## 2021-10-22 ENCOUNTER — Emergency Department (HOSPITAL_BASED_OUTPATIENT_CLINIC_OR_DEPARTMENT_OTHER)
Admission: EM | Admit: 2021-10-22 | Discharge: 2021-10-22 | Disposition: A | Discharge status: Home / Self Care | Payer: Medicaid Other | Attending: Emergency Medicine | Admitting: Emergency Medicine

## 2021-10-22 DIAGNOSIS — E039 Hypothyroidism, unspecified: Secondary | ICD-10-CM | POA: Insufficient documentation

## 2021-10-22 DIAGNOSIS — U071 COVID-19: Secondary | ICD-10-CM | POA: Diagnosis not present

## 2021-10-22 DIAGNOSIS — R509 Fever, unspecified: Secondary | ICD-10-CM | POA: Diagnosis not present

## 2021-10-22 DIAGNOSIS — I10 Essential (primary) hypertension: Secondary | ICD-10-CM | POA: Insufficient documentation

## 2021-10-22 DIAGNOSIS — R059 Cough, unspecified: Secondary | ICD-10-CM | POA: Diagnosis not present

## 2021-10-22 DIAGNOSIS — Z20822 Contact with and (suspected) exposure to covid-19: Secondary | ICD-10-CM | POA: Insufficient documentation

## 2021-10-22 LAB — RESP PANEL BY RT-PCR (FLU A&B, COVID) ARPGX2
Influenza A by PCR: NEGATIVE
Influenza B by PCR: NEGATIVE
SARS Coronavirus 2 by RT PCR: POSITIVE — AB

## 2021-10-22 MED ORDER — DEXAMETHASONE 4 MG PO TABS
10.0000 mg | ORAL_TABLET | Freq: Once | ORAL | Status: AC
  Administered 2021-10-22: 10 mg via ORAL
  Filled 2021-10-22: qty 3

## 2021-10-22 MED ORDER — ALBUTEROL SULFATE HFA 108 (90 BASE) MCG/ACT IN AERS
2.0000 | INHALATION_SPRAY | Freq: Once | RESPIRATORY_TRACT | Status: AC
  Administered 2021-10-22: 2 via RESPIRATORY_TRACT
  Filled 2021-10-22: qty 6.7

## 2021-10-22 MED ORDER — AEROCHAMBER PLUS FLO-VU MEDIUM MISC
1.0000 | Freq: Once | Status: AC
  Administered 2021-10-22: 1
  Filled 2021-10-22: qty 1

## 2021-10-22 MED ORDER — ACETAMINOPHEN 325 MG PO TABS
650.0000 mg | ORAL_TABLET | Freq: Once | ORAL | Status: AC | PRN
  Administered 2021-10-22: 650 mg via ORAL
  Filled 2021-10-22: qty 2

## 2021-10-22 NOTE — ED Notes (Signed)
RT educated pt on proper use of MDI w/spacer. Pt able to perform w/out difficulty. Pt respiratory status is stable w/no distress noted.

## 2021-10-22 NOTE — ED Provider Notes (Signed)
[facility] EMERGENCY DEPT Provider Note   CSN: [id number] Arrival date & time: 10/22/21  0120     History Chief Complaint  Patient presents with   Fever    Sheri Brock is a 35 y.o. female.  HPI     This is a 35 year old female with a history of hypothyroidism, hypertension who presents with fever, cough, body aches.  Onset of symptoms yesterday.  Reports history of prior bronchitis requiring some inhalers but no history of asthma.  She has had a dry cough.  She also reports a fever to 102.  No known sick contacts, COVID exposures, flu exposures.  She took Tylenol around 6:30 PM with some improvement.  Denies sore throat, chest pain, shortness of breath.  No nausea, vomiting, or diarrhea.  She has received her primary COVID-vaccine series.  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 Authorizing Provider  methimazole (TAPAZOLE) 10 MG tablet Take 3 tablets (30 mg total) by mouth daily. 06/16/21   Romero Belling, MD    Allergies    Patient has no known allergies.  Review of Systems   Review of Systems  Constitutional:   Positive for chills and fever.  HENT:  Positive for congestion. Negative for sore throat.   Respiratory:  Positive for cough. Negative for shortness of breath.   Cardiovascular:  Negative for chest pain.  Gastrointestinal:  Negative for abdominal pain, nausea and vomiting.  All other systems reviewed and are negative.  Physical Exam Updated Vital Signs BP (!) 148/89 (BP Location: Left Arm)   Pulse (!) 115   Temp (!) 101.3 F (38.5 C) (Oral)   Resp 20   Ht 1.702 m (5\' 7" )   Wt 113.4 kg   LMP 09/30/2021   SpO2 96%   BMI 39.16 kg/m   Physical Exam Vitals and nursing note reviewed.  Constitutional:      Appearance: She is well-developed. She is obese. She is not ill-appearing.  HENT:     Head: Normocephalic and atraumatic.     Nose: Congestion present.     Mouth/Throat:     Mouth: Mucous membranes are moist.  Eyes:     Pupils: Pupils are equal, round, and reactive to light.  Cardiovascular:     Rate  and Rhythm: Regular rhythm. Tachycardia present.     Heart sounds: Normal heart sounds.  Pulmonary:     Effort: Pulmonary effort is normal. No respiratory distress.     Breath sounds: No wheezing.     Comments: Coughing, occasional wheeze noted, no respiratory distress Abdominal:     Palpations: Abdomen is soft.     Tenderness: There is no abdominal tenderness.  Musculoskeletal:     Cervical back: Neck supple.     Right lower leg: No edema.     Left lower leg: No edema.  Skin:    General: Skin is warm and dry.  Neurological:     Mental Status: She is alert and oriented to person, place, and time.  Psychiatric:        Mood and Affect: Mood normal.    ED Results / Procedures / Treatments   Labs (all labs ordered are listed, but only abnormal results are displayed) Labs Reviewed  RESP PANEL BY RT-PCR (FLU A&B, COVID) ARPGX2 - Abnormal; Notable for the following components:      Result Value   SARS Coronavirus 2 by RT PCR POSITIVE (*)    All other components within  normal limits    EKG None  Radiology DG Chest 2 View  Result Date: 10/22/2021 CLINICAL DATA:  Cough, fever EXAM: CHEST - 2 VIEW COMPARISON:  04/22/2021 FINDINGS: The heart size and mediastinal contours are within normal limits. Both lungs are clear. The visualized skeletal structures are unremarkable. IMPRESSION: No active cardiopulmonary disease. Electronically Signed   By: Helyn Numbers M.D.   On: 10/22/2021 02:19    Procedures Procedures   Medications Ordered in ED Medications  dexamethasone (DECADRON) tablet 10 mg (has no administration in time range)  acetaminophen (TYLENOL) tablet 650 mg (650 mg Oral Given 10/22/21 0142)  albuterol (VENTOLIN HFA) 108 (90 Base) MCG/ACT inhaler 2 puff (2 puffs Inhalation Given 10/22/21 0245)  AeroChamber Plus Flo-Vu Medium MISC 1 each (1 each Other Given 10/22/21 0244)    ED Course  I have reviewed the triage vital signs and the nursing notes.  Pertinent labs & imaging results that were available during my care of the patient were reviewed by me and considered in my medical decision making (see chart for details).    MDM Rules/Calculators/A&P                           Patient presents with cough and fever.  She is nontoxic.  Vital signs notable for fever of one 1.3.  She is tachycardic.  Likely related to fever.  She is in no respiratory distress.  Considerations include viral etiology such as COVID-19 or influenza, pneumonia.  X-ray shows no evidence of acute pneumonia or pneumothorax.  COVID and influenza testing sent.  She is COVID-positive.  We discussed pros and cons of Paxlovid.  Patient declined.  Given her history of bronchitis she was given an inhaler and 1 dose of Decadron for the occasional wheeze.  Recommend supportive measures otherwise.  After history, exam, and medical workup I feel the patient has been appropriately medically screened and is safe for discharge home. Pertinent diagnoses were discussed with the patient. Patient  was given return precautions.  Final Clinical Impression(s) / ED Diagnoses Final diagnoses:  COVID-19    Rx / DC Orders ED Discharge Orders     None        Quintina Hakeem, Mayer Masker, MD 10/22/21 [phone]

## 2021-10-22 NOTE — ED Notes (Signed)
Dr. Horton aware of positive covid result.  

## 2021-10-22 NOTE — ED Triage Notes (Signed)
Fever, cough, runny nose started today. Last took tylenol around 1830.

## 2021-10-22 NOTE — Discharge Instructions (Signed)
You were seen today for fever and cough.  Your COVID-19 testing is positive.  You need to quarantine from others for at least 5 days from onset of symptoms.  If it 5 days you are symptom-free, you can go back to work.  Take your inhaler every 4 hours as needed for cough and shortness of breath.  Continue Tylenol and ibuprofen for body aches and pains or fever.

## 2021-10-30 DIAGNOSIS — F411 Generalized anxiety disorder: Secondary | ICD-10-CM | POA: Diagnosis not present

## 2021-11-19 IMAGING — DX DG CHEST 1V PORT
1 series · 1 of 1 positions shown · non-contrast
Comparison: 12/20/2018

CLINICAL DATA: Cough, fever, left-sided chest pain.

EXAM:
PORTABLE CHEST 1 VIEW

[chest]
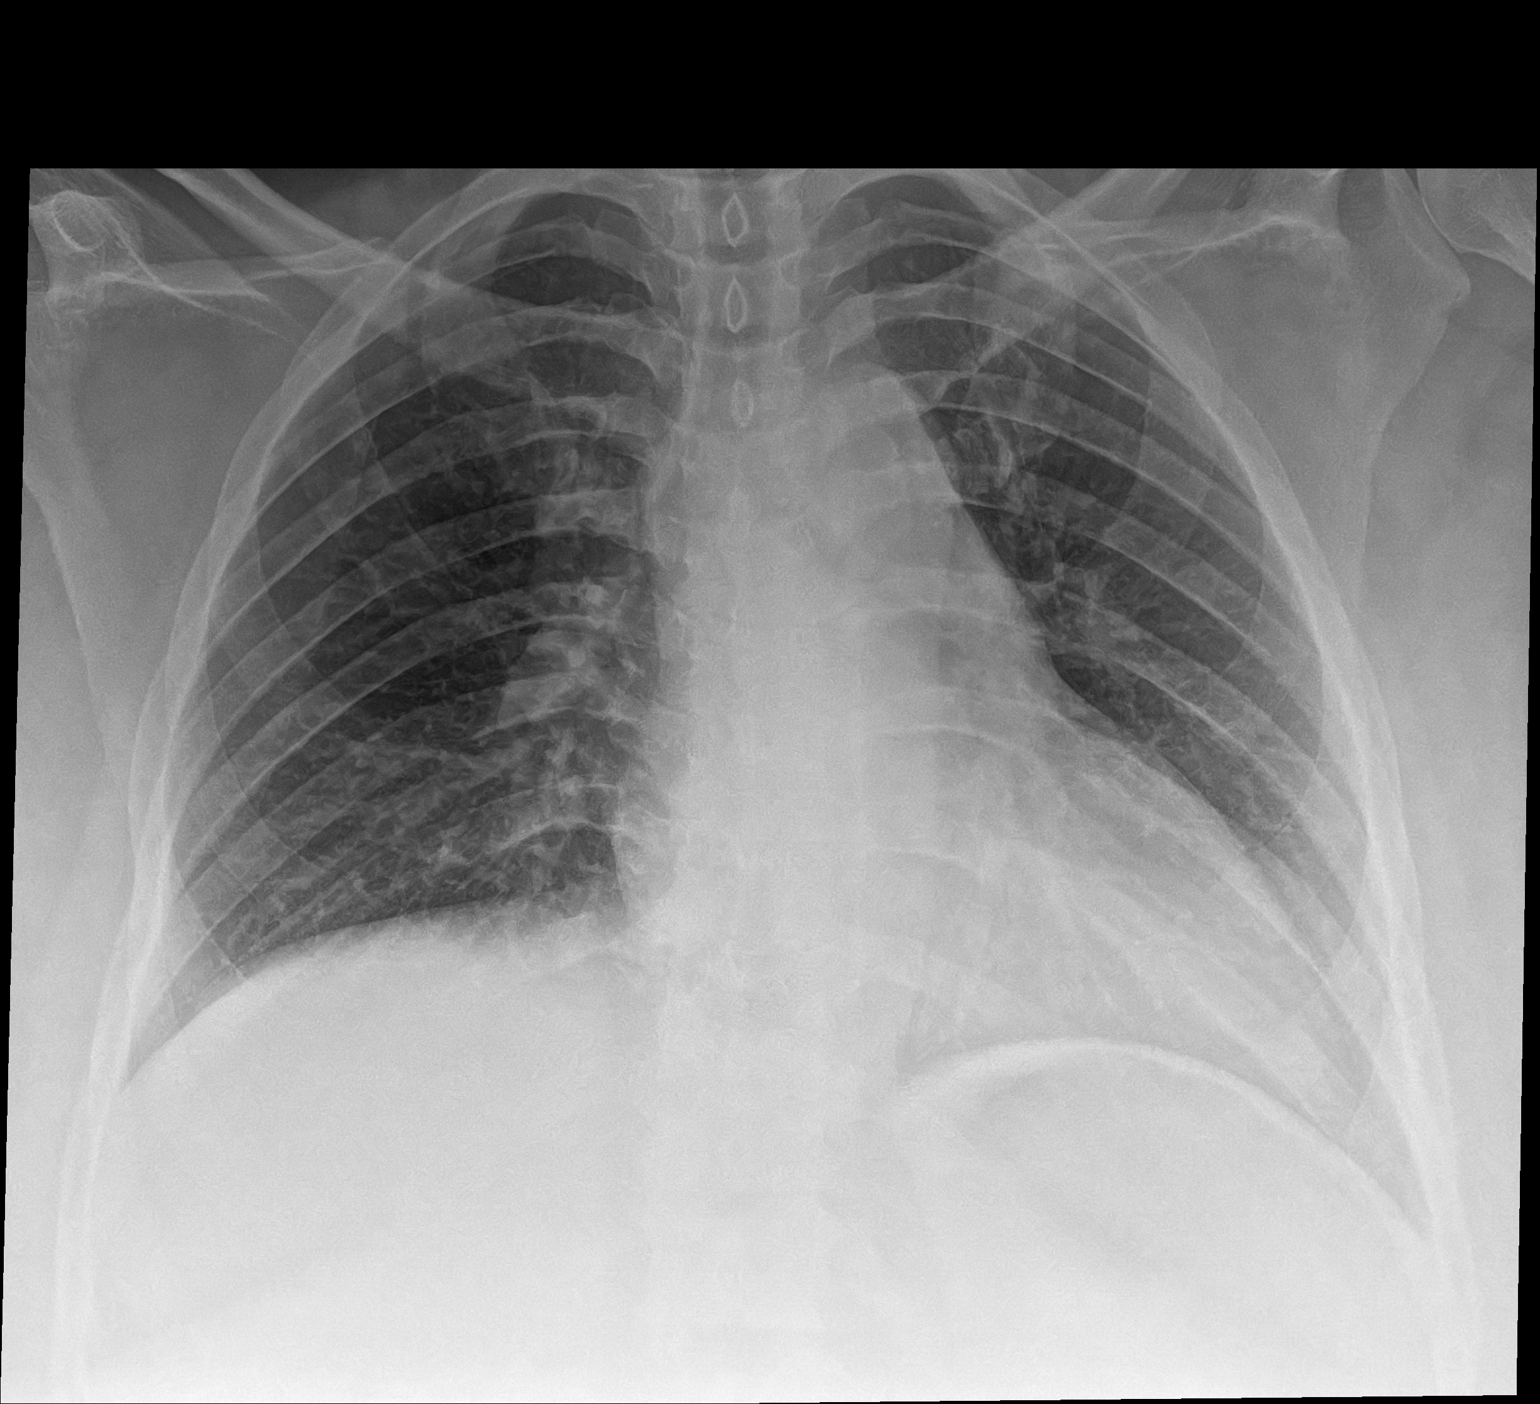

[1 of 1 positions shown; findings below may reference images not displayed]

FINDINGS: Low lung volumes.The cardiomediastinal contours are normal. The
lungs are clear. Pulmonary vasculature is normal. No consolidation,
pleural effusion, or pneumothorax. No acute osseous abnormalities
are seen.
IMPRESSION: No acute chest findings.

## 2022-01-08 DIAGNOSIS — F411 Generalized anxiety disorder: Secondary | ICD-10-CM | POA: Diagnosis not present

## 2022-01-11 ENCOUNTER — Other Ambulatory Visit: Payer: Self-pay

## 2022-01-11 ENCOUNTER — Encounter: Payer: Self-pay | Admitting: Endocrinology

## 2022-01-11 ENCOUNTER — Ambulatory Visit (INDEPENDENT_AMBULATORY_CARE_PROVIDER_SITE_OTHER): Payer: Medicaid Other | Admitting: Endocrinology

## 2022-01-11 VITALS — BP 116/78 | HR 81 | Ht 67.0 in | Wt 246.4 lb

## 2022-01-11 DIAGNOSIS — E0590 Thyrotoxicosis, unspecified without thyrotoxic crisis or storm: Secondary | ICD-10-CM

## 2022-01-11 DIAGNOSIS — E0500 Thyrotoxicosis with diffuse goiter without thyrotoxic crisis or storm: Secondary | ICD-10-CM

## 2022-01-11 LAB — TSH: TSH: 0.8 u[IU]/mL (ref 0.35–5.50)

## 2022-01-11 LAB — T4, FREE: Free T4: 0.83 ng/dL (ref 0.60–1.60)

## 2022-01-11 NOTE — Patient Instructions (Addendum)
Blood tests are requested for you today.  We'll let you know about the results.  Please stop taking the methimazole.  Please see an eye specialist.  you will receive a phone call, about a day and time for an appointment let's check a thyroid "scan" (a special, but easy and painless type of thyroid x ray).  It works like this: you go to the x-ray department of the hospital to swallow a pill, which contains a miniscule amount of radiation.  You will not notice any symptoms from this.  You will go back to the x-ray department the next day, to lie down in front of a camera.  The results of this will be sent to me.   Based on the results, i hope to order for you a treatment pill of radioactive iodine.  Although it is a larger amount of radiation, you will again notice no symptoms from this.  The pill is gone from your body in a few days (during which you should stay away from other people), but takes several months to work.  Therefore, please return here approximately 6-8 weeks after the treatment.  This treatment has been available for many years, and the only known side-effect is an underactive thyroid.  It is possible that i would eventually prescribe for you a thyroid hormone pill, which is very inexpensive.  You don't have to worry about side-effects of this thyroid hormone pill, because it is the same molecule your thyroid makes.

## 2022-01-11 NOTE — Progress Notes (Signed)
Subjective:    Patient ID: Sheri Brock, female    DOB: 1986-02-19, 36 y.o.   MRN: [id number]  HPI Pt returns for f/u of hyperthyroidism (dx'ed 2017; she has intermittently been on tapazole since 2018; she has never had dedicated thyroid imaging; she cannot be isolated for the RAI; tapazole was changed to QAM, as she was missing the PM dose; pt says she is not at risk for pregnancy).  pt says she takes Tapazole as rx'ed.  pt states she feels well in general.  She wants to take RAI rx. Past Medical History:  Diagnosis Date   Anxiety    Thyroid disease     Past Surgical History:  Procedure Laterality Date   NO PAST SURGERIES      Social History   Socioeconomic History   Marital status: Single    Spouse name: Not on file   Number of children: Not on file   Years of education: Not on file   Highest education level: Not on file  Occupational History   Not on file  Tobacco Use   Smoking status: Never   Smokeless tobacco: Never  Substance and Sexual Activity   Alcohol use: Yes   Drug use: No   Sexual activity: Not on file  Other Topics Concern   Not on file  Social History Narrative   Not on file   Social Determinants of Health   Financial Resource Strain: Not on file  Food Insecurity: Not on file  Transportation Needs: Not on file  Physical Activity: Not on file  Stress: Not on file  Social Connections: Not on file  Intimate Partner Violence: Not on file    No current outpatient medications on file prior to visit.   No current facility-administered medications on file prior to visit.    No Known Allergies  Family History  Problem Relation Age of Onset   Thyroid disease Neg Hx     BP 116/78 (BP Location: Left Arm, Patient Position: Sitting, Cuff Size: Normal)    Pulse 81    Ht 5\' 7"  (1.702 m)    Wt 246 lb 6.4 oz (111.8 kg)    SpO2 98%    BMI 38.59 kg/m    Review of Systems Denies fever    Objective:   Physical Exam VITAL SIGNS:  See vs  page GENERAL: no distress Eyes: bilat proptosis. NECK: thyroid is slightly and diffusely enlarged.     Lab Results  Component Value Date   TSH 0.80 01/11/2022   T3TOTAL 312 (H) 03/04/2018   T4TOTAL 10.0 05/28/2018      Assessment & Plan:  Hyperthyroidism: well-controlled. Grave's opthalmopathy: persistent.   Patient Instructions  Blood tests are requested for you today.  We'll let you know about the results.  Please stop taking the methimazole.  Please see an eye specialist.  you will receive a phone call, about a day and time for an appointment let's check a thyroid "scan" (a special, but easy and painless type of thyroid x ray).  It works like this: you go to the x-ray department of the hospital to swallow a pill, which contains a miniscule amount of radiation.  You will not notice any symptoms from this.  You will go back to the x-ray department the next day, to lie down in front of a camera.  The results of this will be sent to me.   Based on the results, i hope to order for you a treatment pill of radioactive  iodine.  Although it is a larger amount of radiation, you will again notice no symptoms from this.  The pill is gone from your body in a few days (during which you should stay away from other people), but takes several months to work.  Therefore, please return here approximately 6-8 weeks after the treatment.  This treatment has been available for many years, and the only known side-effect is an underactive thyroid.  It is possible that i would eventually prescribe for you a thyroid hormone pill, which is very inexpensive.  You don't have to worry about side-effects of this thyroid hormone pill, because it is the same molecule your thyroid makes.

## 2022-01-17 DIAGNOSIS — N812 Incomplete uterovaginal prolapse: Secondary | ICD-10-CM | POA: Diagnosis not present

## 2022-01-17 DIAGNOSIS — N884 Hypertrophic elongation of cervix uteri: Secondary | ICD-10-CM | POA: Diagnosis not present

## 2022-02-02 ENCOUNTER — Encounter: Payer: Self-pay | Admitting: Endocrinology

## 2022-03-06 ENCOUNTER — Encounter: Payer: Self-pay | Admitting: Endocrinology

## 2022-03-26 ENCOUNTER — Other Ambulatory Visit: Payer: Self-pay | Admitting: Endocrinology

## 2022-03-26 DIAGNOSIS — E0590 Thyrotoxicosis, unspecified without thyrotoxic crisis or storm: Secondary | ICD-10-CM

## 2022-04-24 ENCOUNTER — Emergency Department (HOSPITAL_BASED_OUTPATIENT_CLINIC_OR_DEPARTMENT_OTHER): Payer: Medicaid Other | Admitting: Radiology

## 2022-04-24 ENCOUNTER — Encounter (HOSPITAL_BASED_OUTPATIENT_CLINIC_OR_DEPARTMENT_OTHER): Payer: Self-pay

## 2022-04-24 ENCOUNTER — Emergency Department (HOSPITAL_BASED_OUTPATIENT_CLINIC_OR_DEPARTMENT_OTHER)
Admission: EM | Admit: 2022-04-24 | Discharge: 2022-04-24 | Disposition: A | Discharge status: Home / Self Care | Payer: Medicaid Other | Attending: Emergency Medicine | Admitting: Emergency Medicine

## 2022-04-24 DIAGNOSIS — R202 Paresthesia of skin: Secondary | ICD-10-CM | POA: Insufficient documentation

## 2022-04-24 DIAGNOSIS — R0789 Other chest pain: Secondary | ICD-10-CM | POA: Insufficient documentation

## 2022-04-24 DIAGNOSIS — R079 Chest pain, unspecified: Secondary | ICD-10-CM

## 2022-04-24 LAB — CBC
HCT: 36.7 % (ref 36.0–46.0)
Hemoglobin: 11.7 g/dL — ABNORMAL LOW (ref 12.0–15.0)
MCH: 25.3 pg — ABNORMAL LOW (ref 26.0–34.0)
MCHC: 31.9 g/dL (ref 30.0–36.0)
MCV: 79.4 fL — ABNORMAL LOW (ref 80.0–100.0)
Platelets: 354 10*3/uL (ref 150–400)
RBC: 4.62 MIL/uL (ref 3.87–5.11)
RDW: 13 % (ref 11.5–15.5)
WBC: 8.6 10*3/uL (ref 4.0–10.5)
nRBC: 0 % (ref 0.0–0.2)

## 2022-04-24 LAB — BASIC METABOLIC PANEL
Anion gap: 12 (ref 5–15)
BUN: 16 mg/dL (ref 6–20)
CO2: 22 mmol/L (ref 22–32)
Calcium: 9.4 mg/dL (ref 8.9–10.3)
Chloride: 106 mmol/L (ref 98–111)
Creatinine, Ser: 0.49 mg/dL (ref 0.44–1.00)
GFR, Estimated: >60 mL/min (ref >60)
Glucose, Bld: 102 mg/dL — ABNORMAL HIGH (ref 70–99)
Potassium: 3.9 mmol/L (ref 3.5–5.1)
Sodium: 140 mmol/L (ref 135–145)

## 2022-04-24 LAB — TROPONIN I (HIGH SENSITIVITY): Troponin I (High Sensitivity): <2 ng/L (ref <18)

## 2022-04-24 NOTE — ED Triage Notes (Signed)
She c/o central chest discomfort x 1 week. She also c/o some right arm discomfort and "numbness" x 2 days. She is ambulatory and in no distress. ?

## 2022-04-24 NOTE — ED Provider Notes (Signed)
?[facility] ?Provider Note ? ? ?CSN: [id number] ?Arrival date & time: 04/24/22  1755 ? ?  ? ?History ? ?Chief Complaint  ?Patient presents with  ? Chest Pain  ? ? ?Baleigh Bertrand is a 36 y.o. female. ? ?Patient presents ER chief complaint of chest pain ongoing for about a week.  Describes it as tightness in her chest made worse when she stretches out her chest or turns her torso a certain way.  Also associated with tingling sensations in her arms and legs abdomen intermittent for the past 2 days.  She states the last about 3 to 5 minutes at a time and then resolve and then recur off and on throughout the day.  Otherwise denies any fevers or cough no vomiting or diarrhea. ? ? ?  ? ?Home Medications ?Prior to Admission medications   ?Not on File  ?   ? ?Allergies    ?Patient has no known allergies.   ? ?Review of Systems   ?Review of Systems  ?Constitutional:  Negative for fever.  ?HENT:  Negative for ear pain.   ?Eyes:  Negative for pain.  ?Respiratory:  Negative for cough.   ?Cardiovascular:  Positive for chest pain.  ?Gastrointestinal:  Negative for abdominal pain.  ?Genitourinary:  Negative for flank pain.  ?Musculoskeletal:  Negative for back pain.  ?Skin:  Negative for rash.  ?Neurological:  Negative for headaches.  ? ?Physical Exam ?Updated Vital Signs ?BP (!) 168/74   Pulse 64   Temp 98.4 ?F (36.9 ?C)   Resp 16   LMP 04/20/2022 (Approximate)   SpO2 99%  ?Physical Exam ?Constitutional:   ?   General: She is not in acute distress. ?   Appearance: Normal appearance.  ?HENT:  ?   Head: Normocephalic.  ?   Nose: Nose normal.  ?Eyes:  ?   Extraocular Movements: Extraocular movements intact.  ?Cardiovascular:  ?   Rate and Rhythm: Normal rate.  ?Pulmonary:  ?   Effort: Pulmonary effort is normal.  ?Musculoskeletal:     ?   General: Normal range of motion.  ?   Cervical back: Normal range of motion.  ?   Comments: 5/5 strength all extremities.  No evidence focal neurodeficit.   Neurovascular intact bilateral upper extremities with soft compartments.  ?Neurological:  ?   General: No focal deficit present.  ?   Mental Status: She is alert. Mental status is at baseline.  ? ? ?ED Results / Procedures / Treatments   ?Labs ?(all labs ordered are listed, but only abnormal results are displayed) ?Labs Reviewed  ?BASIC METABOLIC PANEL - Abnormal; Notable for the following components:  ?    Result Value  ? Glucose, Bld 102 (*)   ? All other components within normal limits  ?CBC - Abnormal; Notable for the following components:  ? Hemoglobin 11.7 (*)   ? MCV 79.4 (*)   ? MCH 25.3 (*)   ? All other components within normal limits  ?PREGNANCY, URINE  ?TROPONIN I (HIGH SENSITIVITY)  ? ? ?EKG ?EKG Interpretation ? ?Date/Time:  Wednesday Apr 24 2022 18:06:29 EDT ?Ventricular Rate:  87 ?PR Interval:  162 ?QRS Duration: 76 ?QT Interval:  390 ?QTC Calculation: 469 ?R Axis:   61 ?Text Interpretation: Normal sinus rhythm Cannot rule out Anterior infarct , age undetermined Abnormal ECG When compared with ECG of 20-Dec-2018 00:19, PREVIOUS ECG IS PRESENT Confirmed by Thamas Jaegers ([id number]) on 04/24/2022 9:13:39 PM ? ?Radiology ?DG Chest 2 View ? ?  Result Date: 04/24/2022 ?CLINICAL DATA:  Chest pain. EXAM: CHEST - 2 VIEW COMPARISON:  October 22, 2021. FINDINGS: The heart size and mediastinal contours are within normal limits. Both lungs are clear. The visualized skeletal structures are unremarkable. IMPRESSION: No active cardiopulmonary disease. Electronically Signed   By: Marijo Conception M.D.   On: 04/24/2022 18:29   ? ?Procedures ?Procedures  ? ? ?Medications Ordered in ED ?Medications - No data to display ? ?ED Course/ Medical Decision Making/ A&P ?  ?                        ?Medical Decision Making ?Amount and/or Complexity of Data Reviewed ?Labs: ordered. ?Radiology: ordered. ? ? ?History obtained from family member at bedside. ? ?Chart review shows office visit with OB/GYN on 06/27/2022. ? ?Work-up includes labs  chest x-ray which were unremarkable.  EKG shows sinus rhythm no ST elevations depressions noted.  Troponin is negative. ? ?Patient discharged home in stable condition, advised outpatient follow-up with her doctor this week.  Advised return for worsening symptoms or any additional concerns. ? ? ? ? ? ? ? ?Final Clinical Impression(s) / ED Diagnoses ?Final diagnoses:  ?Chest pain, unspecified type  ? ? ?Rx / DC Orders ?ED Discharge Orders   ? ? None  ? ?  ? ? ?  ?Luna Fuse, MD ?04/24/22 2258 ? ?

## 2022-04-24 NOTE — Discharge Instructions (Signed)
Call your primary care doctor or specialist as discussed in the next 2-3 days.   Return immediately back to the ER if:  Your symptoms worsen within the next 12-24 hours. You develop new symptoms such as new fevers, persistent vomiting, new pain, shortness of breath, or new weakness or numbness, or if you have any other concerns.  

## 2022-05-02 ENCOUNTER — Encounter (HOSPITAL_COMMUNITY): Payer: Medicaid Other

## 2022-05-02 ENCOUNTER — Encounter (HOSPITAL_COMMUNITY)
Admission: RE | Admit: 2022-05-02 | Discharge: 2022-05-02 | Disposition: A | Discharge status: Home / Self Care | Payer: Medicaid Other | Source: Ambulatory Visit | Attending: Endocrinology | Admitting: Endocrinology

## 2022-05-02 DIAGNOSIS — E0590 Thyrotoxicosis, unspecified without thyrotoxic crisis or storm: Secondary | ICD-10-CM | POA: Diagnosis not present

## 2022-05-02 MED ORDER — SODIUM IODIDE I 131 CAPSULE
15.3000 | Freq: Once | INTRAVENOUS | Status: AC | PRN
  Administered 2022-05-02: 15.3 via ORAL

## 2022-05-03 ENCOUNTER — Encounter (HOSPITAL_COMMUNITY): Payer: Medicaid Other

## 2022-05-03 ENCOUNTER — Encounter (HOSPITAL_COMMUNITY)
Admission: RE | Admit: 2022-05-03 | Discharge: 2022-05-03 | Disposition: A | Discharge status: Home / Self Care | Payer: Medicaid Other | Source: Ambulatory Visit | Attending: Endocrinology | Admitting: Endocrinology

## 2022-05-03 ENCOUNTER — Other Ambulatory Visit: Payer: Self-pay | Admitting: Internal Medicine

## 2022-05-03 DIAGNOSIS — E0500 Thyrotoxicosis with diffuse goiter without thyrotoxic crisis or storm: Secondary | ICD-10-CM | POA: Insufficient documentation

## 2022-05-03 MED ORDER — SODIUM IODIDE I 131 CAPSULE
15.3000 | Freq: Once | INTRAVENOUS | Status: AC | PRN
  Administered 2022-05-02: 15.3 via ORAL

## 2022-05-03 MED ORDER — SODIUM PERTECHNETATE TC 99M INJECTION
4.2000 | Freq: Once | INTRAVENOUS | Status: AC | PRN
  Administered 2022-05-03: 4.2 via INTRAVENOUS

## 2022-07-24 ENCOUNTER — Telehealth: Payer: Self-pay | Admitting: Internal Medicine

## 2022-07-24 MED ORDER — METHIMAZOLE 10 MG PO TABS: 10.0000 mg | ORAL_TABLET | Freq: Four times a day (QID) | ORAL | 0 refills | Status: DC

## 2022-07-24 NOTE — Telephone Encounter (Signed)
Patient states that she is taking Methimazole 10mg  4 tabs daily. I don't show a current script on file. She has been out for week . Is it okay to fill/ Has appointment on Friday

## 2022-07-24 NOTE — Telephone Encounter (Signed)
Patient notified

## 2022-07-24 NOTE — Telephone Encounter (Signed)
MEDICATION: Methimazole  PHARMACY:  [facility], [address]  HAS THE PATIENT CONTACTED THEIR PHARMACY?  No  IS THIS A 90 DAY SUPPLY : unknown  IS PATIENT OUT OF MEDICATION: yes  IF NOT; HOW MUCH IS LEFT:   LAST APPOINTMENT DATE: @Visit  date not found  NEXT APPOINTMENT DATE:@8 /18/2023  DO WE HAVE YOUR PERMISSION TO LEAVE A DETAILED MESSAGE?:  OTHER COMMENTS:    **Let patient know to contact pharmacy at the end of the day to make sure medication is ready. **  ** Please notify patient to allow 48-72 hours to process**  **Encourage patient to contact the pharmacy for refills or they can request refills through [facility]**

## 2022-07-26 ENCOUNTER — Encounter: Payer: Self-pay | Admitting: Internal Medicine

## 2022-07-26 ENCOUNTER — Ambulatory Visit (INDEPENDENT_AMBULATORY_CARE_PROVIDER_SITE_OTHER): Payer: Medicaid Other | Admitting: Internal Medicine

## 2022-07-26 VITALS — BP 128/96 | HR 94 | Ht 67.0 in | Wt 246.2 lb

## 2022-07-26 DIAGNOSIS — E0590 Thyrotoxicosis, unspecified without thyrotoxic crisis or storm: Secondary | ICD-10-CM

## 2022-07-26 DIAGNOSIS — E0500 Thyrotoxicosis with diffuse goiter without thyrotoxic crisis or storm: Secondary | ICD-10-CM

## 2022-07-26 LAB — T4, FREE: Free T4: 1.05 ng/dL (ref 0.60–1.60)

## 2022-07-26 LAB — TSH: TSH: 0.04 u[IU]/mL — ABNORMAL LOW (ref 0.35–5.50)

## 2022-07-26 MED ORDER — METHIMAZOLE 10 MG PO TABS: 50.0000 mg | ORAL_TABLET | Freq: Every day | ORAL | 1 refills | Status: DC

## 2022-07-26 NOTE — Progress Notes (Signed)
Name: Sheri Brock  MRN/ DOB: [id number], 1986-07-07    Age/ Sex: 36 y.o., female     PCP: Rosemary Holms   Reason for Endocrinology Evaluation: Hyperthyroidism     Initial Endocrinology Clinic Visit: 05/13/2018    PATIENT IDENTIFIER: [name] is a 36 y.o., female with a past medical history of hyperthyroid. She has followed with [facility] since 05/13/2018 for consultative assistance with management of her hyperthyroidism.   HISTORICAL SUMMARY: The patient was first diagnosed with hyperthyroid in 2017.  She has been on thionamides therapy intermittently since 2018.  This has been attributed to Graves' disease Thyroid uptake and scan in May 2023 consistent with Graves' disease with a 24-hour I-131 63%     No Fh of thyroid disease   SUBJECTIVE:   Today (07/26/2022):  [name] is here for follow-up on hyperthyroidism.  Weight has been stable  Has palpitations  Has panic attacks  She has arthralgias Denies diarrhea but has occasional abdominal pain  Has hx of migraine headaches   Denies fever or sore throat  Last eye exam 2 yrs  Has no burning of the eyes but has pressure    She declines RAI ablation due to side effects   Methimazole 10 mg 4 tabs daily  HISTORY:  Past Medical History:  Past Medical History:  Diagnosis Date  . Anxiety   . Thyroid disease    Past Surgical History:  Past Surgical History:  Procedure Laterality Date  . NO PAST SURGERIES     Social History:  reports that she has never smoked. She has never used smokeless tobacco. She reports current alcohol use. She reports that she does not use drugs. Family History:  Family History  Problem Relation Age of Onset  . Thyroid disease Neg Hx      HOME MEDICATIONS: Allergies as of 07/26/2022   No Known Allergies      Medication List        Accurate as of July 26, 2022 11:44 AM. If you have any questions, ask your nurse or doctor.           methimazole 10 MG tablet Commonly known as: TAPAZOLE Take 1 tablet (10 mg total) by mouth in the morning, at noon, in the evening, and at bedtime. What changed: additional instructions          OBJECTIVE:   PHYSICAL EXAM: VS: BP (!) 128/96 (BP Location: Left Arm, Patient Position: Sitting, Cuff Size: Normal)   Pulse 94   Ht 5\' 7"  (1.702 m)   Wt 246 lb 3.2 oz (111.7 kg)   SpO2 98%   BMI 38.56 kg/m    EXAM: General: Pt appears well and is in NAD  Eyes: External eye exam normal with a stare, right eye proptosis but no lid lag .  EOM intact.   Neck: General: Supple without adenopathy. Thyroid: Thyroid is prominent .  , no bruit.  Lungs: Clear with good BS bilat with no rales, rhonchi, or wheezes  Heart: Auscultation: RRR.  Abdomen: Normoactive bowel sounds, soft, nontender, without masses or organomegaly palpable  Extremities:  BL LE: No pretibial edema normal ROM and strength.  Mental Status: Judgment, insight: Intact Orientation: Oriented to time, place, and person Mood and affect: No depression, anxiety, or agitation     DATA REVIEWED:  Latest Reference Range & Units 07/26/22 12:09  TSH 0.35 - 5.50 uIU/mL 0.04 (L)  T4,Free(Direct) 0.60 - 1.60 ng/dL 07/28/22  (L): Data is  abnormally low  Thyroid uptake and scan 05/03/2022  FINDINGS: Planar imaging of the thyroid demonstrates normal homogeneous radiotracer uptake throughout the thyroid without focal abnormality.   4 hour I-131 uptake = 47.6% (normal 5-20%)   24 hour I-131 uptake = 63.0% (normal 10-30%)   IMPRESSION: 1. Elevated iodine uptake values at 4 hours and 24 hours as above. 2. Homogeneous radiotracer uptake within the thyroid without focal abnormality noted.    ASSESSMENT / PLAN / RECOMMENDATIONS:   Hyperthyroidism  -Patient is clinically euthyroid -I have suggested dividing methimazole to twice daily but unfortunately she will forget to take it if she does that -Thyroid uptake and scan in May  2023 showed homogenous uptake with a 63% of I-131 -Patient declines radioactive iodine ablation but she is also not a candidate for it due to Graves' orbitopathy -I have discussed  long-term treatment options with continuing thionamides therapy versus total thyroidectomy.  I did explain to her that with total thyroidectomy she will need lifelong LT-for replacement  Medications  Increase methimazole 10 mg to 5 tablets daily    2. Graves' Disease:  - Will refer to ophthalmology  -Patient with Graves' orbitopathy Follow-up in 4 months  Signed electronically by: Lyndle Herrlich, MD  [facility]  [facility] [address] [place] [place], [place] [address] Phone: [phone] FAX: [phone]      CC: Bryon Lions, PA-C [address] [place] [address] Phone: [phone]  Fax: [phone]   Return to Endocrinology clinic as below: No future appointments.

## 2022-07-27 LAB — T3: T3, Total: 134 ng/dL (ref 76–181)

## 2022-09-03 ENCOUNTER — Encounter: Payer: Self-pay | Admitting: Internal Medicine

## 2022-09-04 ENCOUNTER — Other Ambulatory Visit: Payer: Self-pay

## 2022-09-04 MED ORDER — METHIMAZOLE 10 MG PO TABS: 50.0000 mg | ORAL_TABLET | Freq: Every day | ORAL | 1 refills | Status: DC

## 2022-09-17 ENCOUNTER — Encounter: Payer: Self-pay | Admitting: Family Medicine

## 2022-09-17 ENCOUNTER — Ambulatory Visit (INDEPENDENT_AMBULATORY_CARE_PROVIDER_SITE_OTHER): Payer: 59 | Admitting: Family Medicine

## 2022-09-17 VITALS — BP 124/84 | HR 82 | Temp 97.8°F | Ht 67.0 in | Wt 247.0 lb

## 2022-09-17 DIAGNOSIS — Z833 Family history of diabetes mellitus: Secondary | ICD-10-CM | POA: Diagnosis not present

## 2022-09-17 DIAGNOSIS — R519 Headache, unspecified: Secondary | ICD-10-CM

## 2022-09-17 DIAGNOSIS — R252 Cramp and spasm: Secondary | ICD-10-CM | POA: Diagnosis not present

## 2022-09-17 DIAGNOSIS — R5383 Other fatigue: Secondary | ICD-10-CM

## 2022-09-17 DIAGNOSIS — E0590 Thyrotoxicosis, unspecified without thyrotoxic crisis or storm: Secondary | ICD-10-CM

## 2022-09-17 DIAGNOSIS — F410 Panic disorder [episodic paroxysmal anxiety] without agoraphobia: Secondary | ICD-10-CM

## 2022-09-17 DIAGNOSIS — D649 Anemia, unspecified: Secondary | ICD-10-CM | POA: Diagnosis not present

## 2022-09-17 DIAGNOSIS — F419 Anxiety disorder, unspecified: Secondary | ICD-10-CM | POA: Diagnosis not present

## 2022-09-17 DIAGNOSIS — F418 Other specified anxiety disorders: Secondary | ICD-10-CM | POA: Insufficient documentation

## 2022-09-17 DIAGNOSIS — F32A Depression, unspecified: Secondary | ICD-10-CM

## 2022-09-17 DIAGNOSIS — E669 Obesity, unspecified: Secondary | ICD-10-CM

## 2022-09-17 DIAGNOSIS — G4700 Insomnia, unspecified: Secondary | ICD-10-CM

## 2022-09-17 DIAGNOSIS — R202 Paresthesia of skin: Secondary | ICD-10-CM | POA: Diagnosis not present

## 2022-09-17 LAB — CBC WITH DIFFERENTIAL/PLATELET
Basophils Absolute: 0.1 10*3/uL (ref 0.0–0.1)
Basophils Relative: 0.6 % (ref 0.0–3.0)
Eosinophils Absolute: 0.1 10*3/uL (ref 0.0–0.7)
Eosinophils Relative: 1.7 % (ref 0.0–5.0)
HCT: 37.1 % (ref 36.0–46.0)
Hemoglobin: 12 g/dL (ref 12.0–15.0)
Lymphocytes Relative: 22.4 % (ref 12.0–46.0)
Lymphs Abs: 1.8 10*3/uL (ref 0.7–4.0)
MCHC: 32.3 g/dL (ref 30.0–36.0)
MCV: 82.1 fl (ref 78.0–100.0)
Monocytes Absolute: 0.3 10*3/uL (ref 0.1–1.0)
Monocytes Relative: 3.4 % (ref 3.0–12.0)
Neutro Abs: 5.8 10*3/uL (ref 1.4–7.7)
Neutrophils Relative %: 71.9 % (ref 43.0–77.0)
Platelets: 320 10*3/uL (ref 150.0–400.0)
RBC: 4.52 Mil/uL (ref 3.87–5.11)
RDW: 13.7 % (ref 11.5–15.5)
WBC: 8.1 10*3/uL (ref 4.0–10.5)

## 2022-09-17 LAB — COMPREHENSIVE METABOLIC PANEL
ALT: 12 U/L (ref 0–35)
AST: 16 U/L (ref 0–37)
Albumin: 4 g/dL (ref 3.5–5.2)
Alkaline Phosphatase: 71 U/L (ref 39–117)
BUN: 11 mg/dL (ref 6–23)
CO2: 28 mEq/L (ref 19–32)
Calcium: 9.4 mg/dL (ref 8.4–10.5)
Chloride: 104 mEq/L (ref 96–112)
Creatinine, Ser: 0.6 mg/dL (ref 0.40–1.20)
GFR: 114.23 mL/min (ref >60.00)
Glucose, Bld: 106 mg/dL — ABNORMAL HIGH (ref 70–99)
Potassium: 4.1 mEq/L (ref 3.5–5.1)
Sodium: 138 mEq/L (ref 135–145)
Total Bilirubin: 0.3 mg/dL (ref 0.2–1.2)
Total Protein: 7.5 g/dL (ref 6.0–8.3)

## 2022-09-17 LAB — MAGNESIUM: Magnesium: 1.7 mg/dL (ref 1.5–2.5)

## 2022-09-17 LAB — HEMOGLOBIN A1C: Hgb A1c MFr Bld: 5.9 % (ref 4.6–6.5)

## 2022-09-17 NOTE — Patient Instructions (Signed)
Please go downstairs for labs before you leave.  We will be in touch with your results  Follow-up in 4 weeks or sooner pending your lab results.

## 2022-09-17 NOTE — Progress Notes (Unsigned)
New Patient Office Visit  Subjective    Patient ID: Sheri Brock, female    DOB: 06/12/1986  Age: 36 y.o. MRN: [id number]  CC:  Chief Complaint  Patient presents with   Establish Care    Has been having troubles sleeping and is having leg and back pain. Would like to discuss neurologist referral for nerves. Would also like to be tested for diabetes.     HPI Sheri Brock presents to establish care Previous PCP - [facility] at Dr. Marye Round   Other providers : OB/GYN Endocrinologist   States she is having muscle spasms in her back and legs for the past year. Intermittent. Occurs both daytime and nighttime.   States she does not like to take pills.   Headaches, intermittent for years. 2-3 per week. Usually bilateral frontal and sometimes on the left side.  No aura.  Pressure in her eyes.  Blurry vision on left. No loss of vision.  No nausea or vomiting.   Left arm numbness intermittent over the past few years.   Hx of negative MRI.   States she has insomnia.   Increased palpitations with thyroid issues.  Head injury as a child Diagnosed with migraine headaches as a child.   Anemia-mild  LMP: beginning of October. Regular cycles.    Hx of panic attacks. Last one was last week.  Mood swings. Diagnosed with anxiety and depression.   Denies fever, chills, dizziness, chest pain, palpitations, shortness of breath, abdominal pain, N/V/D, urinary symptoms, LE edema.    Works at [employer]. Also works at [employer].  In a relationship. Has a 38 year old.    Outpatient Encounter Medications as of 09/17/2022  Medication Sig   methimazole (TAPAZOLE) 10 MG tablet Take 5 tablets (50 mg total) by mouth daily.   Multiple Vitamin (MULTIVITAMIN ADULT PO) Take by mouth.   No facility-administered encounter medications on file as of 09/17/2022.    Past Medical History:  Diagnosis Date   Anxiety    Thyroid disease     Past Surgical History:  Procedure Laterality  Date   NO PAST SURGERIES      Family History  Problem Relation Age of Onset   Thyroid disease Neg Hx     Social History   Socioeconomic History   Marital status: Single    Spouse name: Not on file   Number of children: Not on file   Years of education: Not on file   Highest education level: Not on file  Occupational History   Not on file  Tobacco Use   Smoking status: Never   Smokeless tobacco: Never  Substance and Sexual Activity   Alcohol use: Yes   Drug use: No   Sexual activity: Not on file  Other Topics Concern   Not on file  Social History Narrative   Not on file   Social Determinants of Health   Financial Resource Strain: Not on file  Food Insecurity: Not on file  Transportation Needs: Not on file  Physical Activity: Not on file  Stress: Not on file  Social Connections: Not on file  Intimate Partner Violence: Not on file    ROS      Objective    BP 124/84 (BP Location: Left Arm, Patient Position: Sitting, Cuff Size: Large)   Pulse 82   Temp 97.8 F (36.6 C) (Temporal)   Ht 5\' 7"  (1.702 m)   Wt 247 lb (112 kg)   SpO2 99%   BMI 38.69  kg/m   Physical Exam Constitutional:      General: She is not in acute distress.    Appearance: She is not ill-appearing.  Cardiovascular:     Rate and Rhythm: Normal rate and regular rhythm.     Pulses: Normal pulses.  Pulmonary:     Effort: Pulmonary effort is normal.     Breath sounds: Normal breath sounds.  Musculoskeletal:     Cervical back: Normal range of motion and neck supple.     Right lower leg: No edema.     Left lower leg: No edema.  Skin:    General: Skin is warm and dry.  Neurological:     General: No focal deficit present.     Mental Status: She is alert and oriented to person, place, and time.     Cranial Nerves: No cranial nerve deficit.     Sensory: No sensory deficit.     Motor: No weakness.     Coordination: Coordination normal.     Gait: Gait normal.  Psychiatric:        Mood  and Affect: Mood normal.        Behavior: Behavior normal.        Thought Content: Thought content normal.     {Labs (Optional):23779}    Assessment & Plan:   Problem List Items Addressed This Visit       Endocrine   Hyperthyroidism     Other   Anemia   Relevant Orders   CBC with Differential/Platelet   Vitamin B12   Iron, TIBC and Ferritin Panel   Folate   Anxiety and depression   Family history of diabetes mellitus in first degree relative   Relevant Orders   Hemoglobin A1c   Fatigue   Relevant Orders   CBC with Differential/Platelet   Comprehensive metabolic panel   Vitamin B12   VITAMIN D 25 Hydroxy (Vit-D Deficiency, Fractures)   Iron, TIBC and Ferritin Panel   Increased frequency of headaches   Insomnia   Muscle cramps - Primary   Relevant Orders   CBC with Differential/Platelet   Comprehensive metabolic panel   Magnesium   Panic attacks   Paresthesias   Relevant Orders   Vitamin B12   Other Visit Diagnoses     Obesity (BMI 30-39.9)       Relevant Orders   Hemoglobin A1c       Return in about 4 weeks (around 10/15/2022).   [name], NP-C

## 2022-09-18 ENCOUNTER — Encounter: Payer: Self-pay | Admitting: Family Medicine

## 2022-09-18 DIAGNOSIS — E669 Obesity, unspecified: Secondary | ICD-10-CM | POA: Insufficient documentation

## 2022-09-18 DIAGNOSIS — E559 Vitamin D deficiency, unspecified: Secondary | ICD-10-CM | POA: Insufficient documentation

## 2022-09-18 DIAGNOSIS — R7303 Prediabetes: Secondary | ICD-10-CM | POA: Insufficient documentation

## 2022-09-18 HISTORY — DX: Prediabetes: R73.03

## 2022-09-18 HISTORY — DX: Vitamin D deficiency, unspecified: E55.9

## 2022-09-18 LAB — VITAMIN D 25 HYDROXY (VIT D DEFICIENCY, FRACTURES): VITD: 29.17 ng/mL — ABNORMAL LOW (ref 30.00–100.00)

## 2022-09-18 LAB — VITAMIN B12: Vitamin B-12: 258 pg/mL (ref 211–911)

## 2022-09-18 LAB — IRON,TIBC AND FERRITIN PANEL
%SAT: 14 % (calc) — ABNORMAL LOW (ref 16–45)
Ferritin: 21 ng/mL (ref 16–154)
Iron: 54 ug/dL (ref 40–190)
TIBC: 379 mcg/dL (calc) (ref 250–450)

## 2022-09-18 LAB — FOLATE: Folate: 18.4 ng/mL (ref >5.9)

## 2022-09-18 NOTE — Assessment & Plan Note (Addendum)
Check labs including magnesium. Recommend staying well-hydrated.

## 2022-09-18 NOTE — Assessment & Plan Note (Signed)
Recommend good sleep hygiene.  She sleeps for 6 hours and then 2 hours at a different time.

## 2022-09-18 NOTE — Assessment & Plan Note (Signed)
Look for triggers.  Check labs and follow-up.  Negative brain MRI

## 2022-09-18 NOTE — Assessment & Plan Note (Signed)
Managed by endocrinology.

## 2022-09-18 NOTE — Assessment & Plan Note (Signed)
Recommend healthy diet and exercise. 

## 2022-09-18 NOTE — Assessment & Plan Note (Signed)
Check labs and follow up 

## 2022-09-18 NOTE — Assessment & Plan Note (Signed)
Discussed possible etiologies for fatigue.  Check labs and follow-up

## 2022-09-18 NOTE — Assessment & Plan Note (Signed)
Screen for diabetes  

## 2022-09-18 NOTE — Assessment & Plan Note (Signed)
We will does further discuss anxiety and depression as well as panic attacks at her follow-up visit.  She does not appear to be in any danger or threatening harm to herself

## 2022-09-18 NOTE — Assessment & Plan Note (Addendum)
Intermittent and ongoing.  Check labs including CBC, iron, B12. Known thyroid disease

## 2022-10-16 ENCOUNTER — Ambulatory Visit (INDEPENDENT_AMBULATORY_CARE_PROVIDER_SITE_OTHER): Payer: 59 | Admitting: Family Medicine

## 2022-10-16 ENCOUNTER — Encounter: Payer: Self-pay | Admitting: Family Medicine

## 2022-10-16 VITALS — BP 132/84 | HR 64 | Temp 97.6°F | Ht 67.0 in | Wt 244.0 lb

## 2022-10-16 DIAGNOSIS — R7303 Prediabetes: Secondary | ICD-10-CM

## 2022-10-16 DIAGNOSIS — E559 Vitamin D deficiency, unspecified: Secondary | ICD-10-CM | POA: Diagnosis not present

## 2022-10-16 DIAGNOSIS — R202 Paresthesia of skin: Secondary | ICD-10-CM

## 2022-10-16 DIAGNOSIS — N812 Incomplete uterovaginal prolapse: Secondary | ICD-10-CM

## 2022-10-16 NOTE — Progress Notes (Signed)
Subjective:     Patient ID: Sheri Brock, female    DOB: 1986/03/25, 36 y.o.   MRN: [id number]  Chief Complaint  Patient presents with   Follow-up    4 week f/u    HPI Patient is in today for numbness and tingling in her hands and feet but not in the same areas, it moves around.  Paresthesias last for a whole day when present. This has been ongoing for weeks.    Denies fever, chills, dizziness, chest pain, palpitations, shortness of breath, abdominal pain, N/V/D, urinary symptoms, LE edema.      Health Maintenance Due  Topic Date Due   HIV Screening  Never done   Hepatitis C Screening  Never done   INFLUENZA VACCINE  07/09/2022    Past Medical History:  Diagnosis Date   Anxiety    Prediabetes 09/18/2022   Thyroid disease    Vitamin D deficiency 09/18/2022    Past Surgical History:  Procedure Laterality Date   NO PAST SURGERIES      Family History  Problem Relation Age of Onset   Thyroid disease Neg Hx     Social History   Socioeconomic History   Marital status: Single    Spouse name: Not on file   Number of children: Not on file   Years of education: Not on file   Highest education level: Not on file  Occupational History   Not on file  Tobacco Use   Smoking status: Never   Smokeless tobacco: Never  Substance and Sexual Activity   Alcohol use: Yes   Drug use: No   Sexual activity: Not on file  Other Topics Concern   Not on file  Social History Narrative   Not on file   Social Determinants of Health   Financial Resource Strain: Not on file  Food Insecurity: Not on file  Transportation Needs: Not on file  Physical Activity: Not on file  Stress: Not on file  Social Connections: Not on file  Intimate Partner Violence: Not on file    Outpatient Medications Prior to Visit  Medication Sig Dispense Refill   methimazole (TAPAZOLE) 10 MG tablet Take 5 tablets (50 mg total) by mouth daily. 450 tablet 1   Multiple Vitamin (MULTIVITAMIN ADULT  PO) Take by mouth.     No facility-administered medications prior to visit.    No Known Allergies  ROS     Objective:    Physical Exam Constitutional:      General: She is not in acute distress.    Appearance: She is not ill-appearing.  Cardiovascular:     Rate and Rhythm: Normal rate.  Pulmonary:     Effort: Pulmonary effort is normal.  Neurological:     General: No focal deficit present.     Mental Status: She is alert and oriented to person, place, and time.  Psychiatric:        Mood and Affect: Mood normal.        Thought Content: Thought content normal.     BP 132/84 (BP Location: Left Arm, Patient Position: Sitting, Cuff Size: Large)   Pulse 64   Temp 97.6 F (36.4 C) (Temporal)   Ht 5\' 7"  (1.702 m)   Wt 244 lb (110.7 kg)   SpO2 99%   BMI 38.22 kg/m  Wt Readings from Last 3 Encounters:  10/16/22 244 lb (110.7 kg)  09/17/22 247 lb (112 kg)  07/26/22 246 lb 3.2 oz (111.7 kg)  Assessment & Plan:   Problem List Items Addressed This Visit       Genitourinary   Second degree uterine prolapse    Requests OB/GYN. A list was provided and she will call to schedule.         Other   Paresthesias - Primary    Ruled out several etiologies for paresthesias.  Reviewed labs with patient. She is not anemic, B12 is low normal and she is taking a supplement now.  Referral to neurology for further evaluation.       Relevant Orders   Ambulatory referral to Neurology   Prediabetes    Counseling on low sugar, low carbohydrate diet  and exercise.       Vitamin D deficiency    Continue OTC vitamin D       I am having Myan Matto maintain her methimazole and Multiple Vitamin (MULTIVITAMIN ADULT PO).  No orders of the defined types were placed in this encounter.

## 2022-10-16 NOTE — Assessment & Plan Note (Signed)
Counseling on low sugar, low carbohydrate diet  and exercise.

## 2022-10-16 NOTE — Assessment & Plan Note (Signed)
Requests OB/GYN. A list was provided and she will call to schedule.

## 2022-10-16 NOTE — Assessment & Plan Note (Signed)
Continue OTC vitamin D

## 2022-10-16 NOTE — Assessment & Plan Note (Signed)
Ruled out several etiologies for paresthesias.  Reviewed labs with patient. She is not anemic, B12 is low normal and she is taking a supplement now.  Referral to neurology for further evaluation.

## 2022-10-16 NOTE — Patient Instructions (Signed)
Obgyn Offices:   [facility] [address] [place], [place] [address] [phone]  [facility] Address: [address] [place], [place] [address] Phone: [phone]  [facility] [address] [place], [place] [address] Phone: [phone]   [facility] [address] [place], [place] [address] Phone: [phone] 

## 2022-11-21 IMAGING — DX DG CHEST 2V
2 series · 2 of 2 positions shown · non-contrast
Comparison: October 22, 2021.

CLINICAL DATA: Chest pain.

EXAM:
CHEST - 2 VIEW

[chest pa]
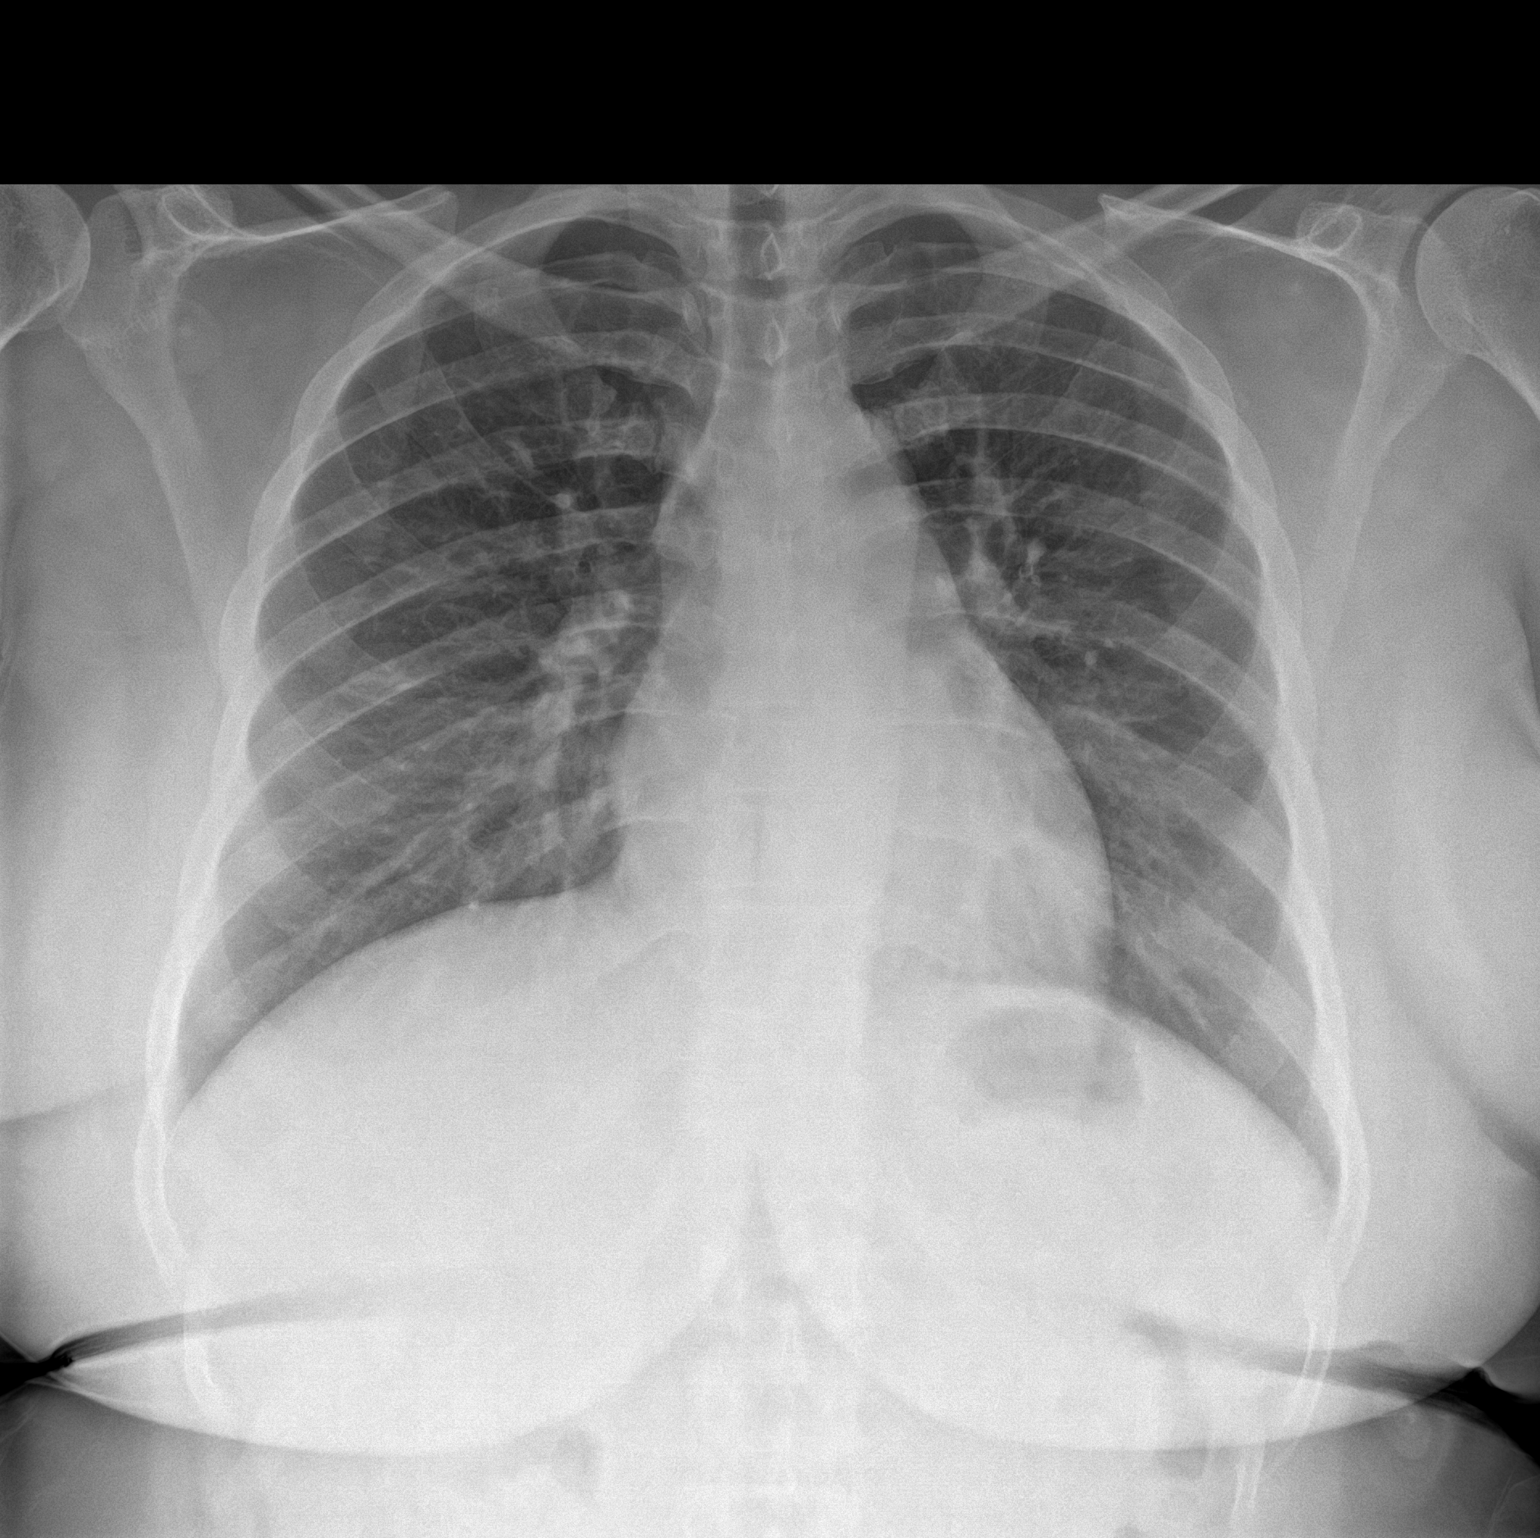

[chest lat]
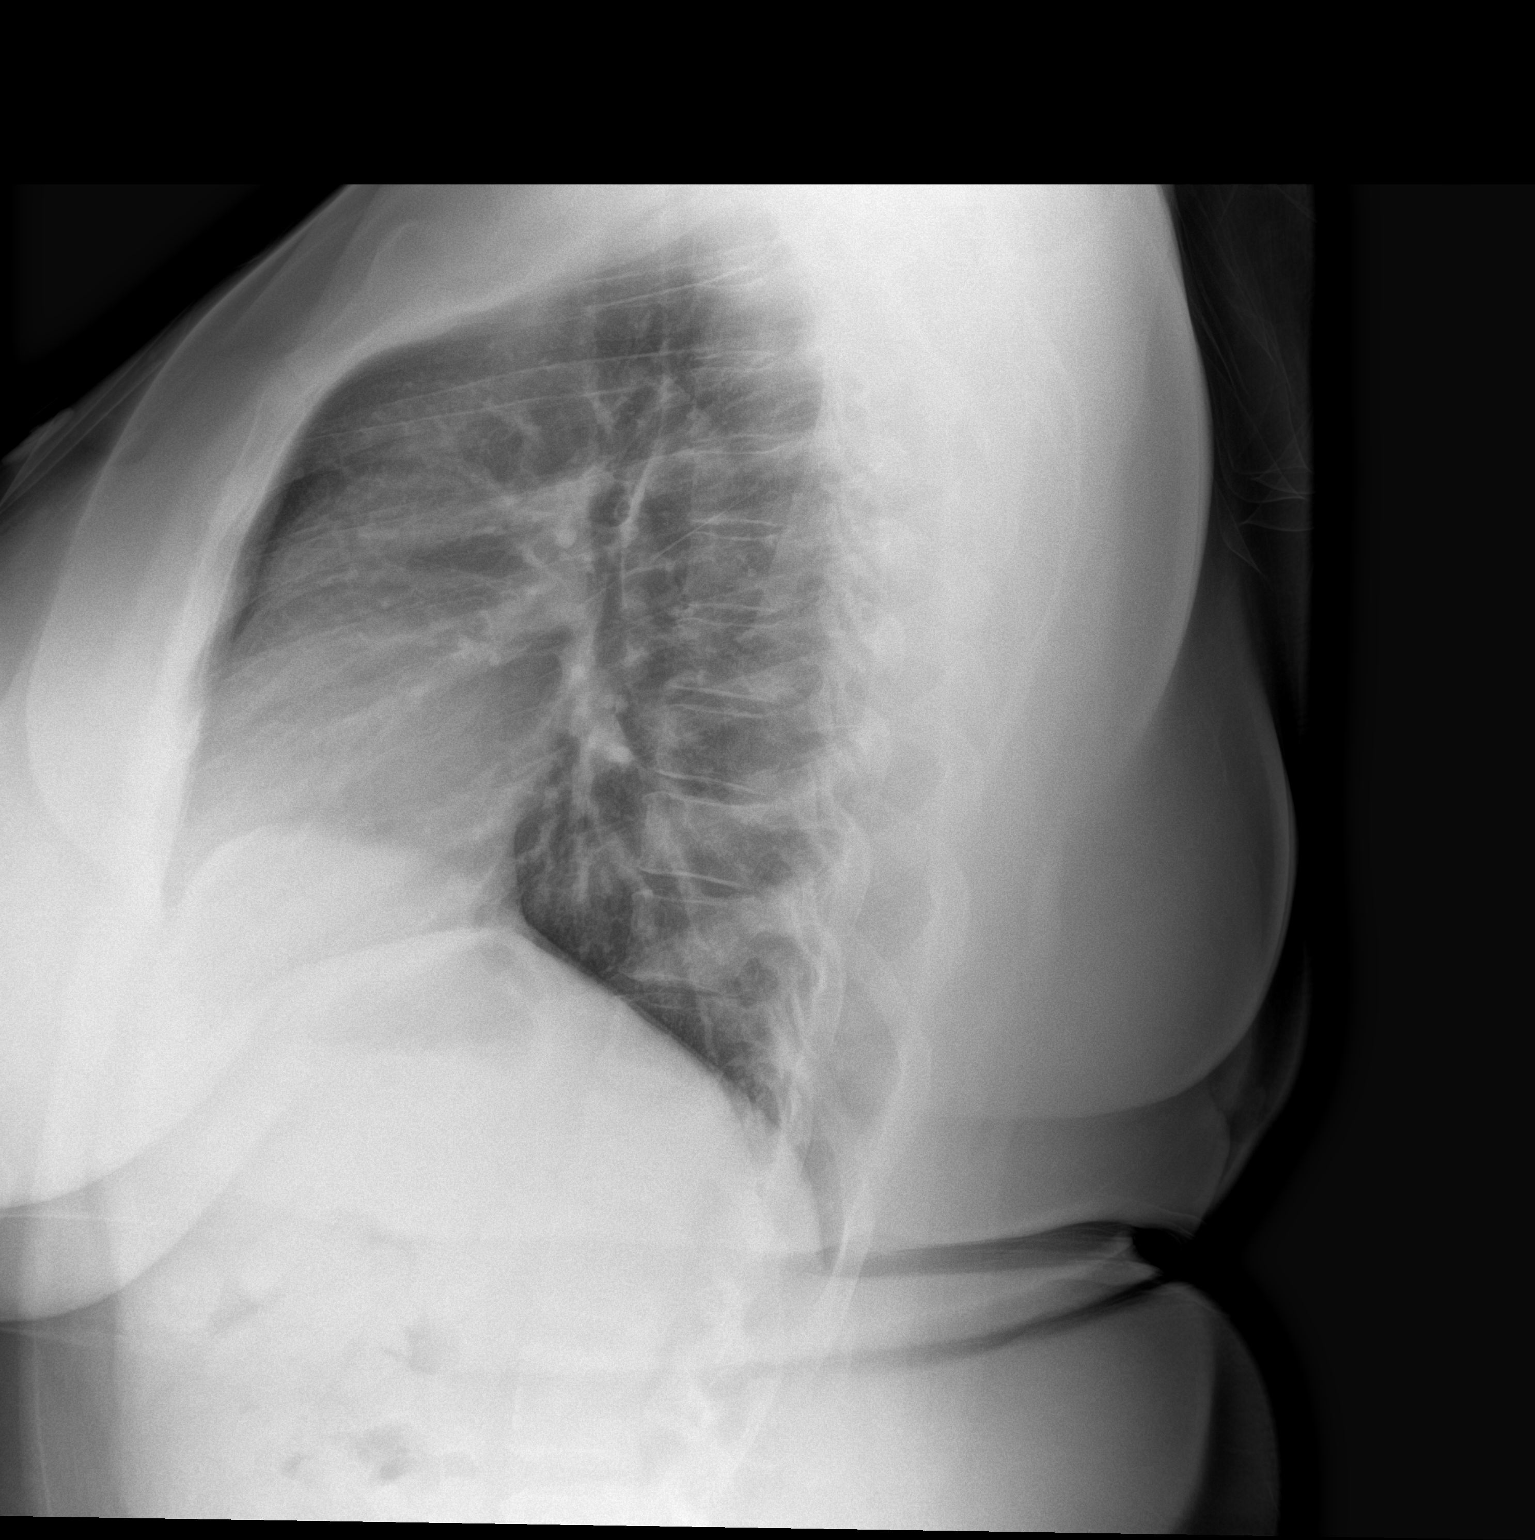

[2 of 2 positions shown; findings below may reference images not displayed]

FINDINGS: The heart size and mediastinal contours are within normal limits.
Both lungs are clear. The visualized skeletal structures are
unremarkable.
IMPRESSION: No active cardiopulmonary disease.

## 2022-12-03 ENCOUNTER — Ambulatory Visit (INDEPENDENT_AMBULATORY_CARE_PROVIDER_SITE_OTHER): Payer: 59 | Admitting: Neurology

## 2022-12-03 ENCOUNTER — Encounter: Payer: Self-pay | Admitting: Neurology

## 2022-12-03 VITALS — BP 153/80 | HR 72 | Ht 67.0 in | Wt 239.0 lb

## 2022-12-03 DIAGNOSIS — F418 Other specified anxiety disorders: Secondary | ICD-10-CM | POA: Diagnosis not present

## 2022-12-03 DIAGNOSIS — E0590 Thyrotoxicosis, unspecified without thyrotoxic crisis or storm: Secondary | ICD-10-CM | POA: Diagnosis not present

## 2022-12-03 DIAGNOSIS — R202 Paresthesia of skin: Secondary | ICD-10-CM

## 2022-12-03 NOTE — Progress Notes (Addendum)
Chief Complaint  Patient presents with   New Patient (Initial Visit)    Rm 15 with partner here for consult on Paresthesia; Pt reports a shooting pain in her legs and skin pin-prick sensations. Symptoms are intermittent and have been present for some time now (est 8 months or so).      ASSESSMENT AND PLAN  Sheri Brock is a 36 y.o. female   Intermittent upper lower extremity paresthesia  In the setting of suboptimal control of depression anxiety, hyperthyroidism, which can potentially explain a lot of her sensory symptoms  Normal neurological examination,  MRI of brain to rule out structure abnormalities.  Will call her result, only return to neurology if abnormality found.   DIAGNOSTIC DATA (LABS, IMAGING, TESTING) - I reviewed patient records, labs, notes, testing and imaging myself where available.   MEDICAL HISTORY:  Sheri Brock, is a 36 year old female, accompanied by partner seen in request by her primary care nurse practitioner   Sheri Brock for evaluation of intermittent paresthesia, initial evaluation December 03, 2022  I reviewed and summarized the referring note. PMhx. Grave's disease, on methimazole 10mg  5 tabs a day for 2 years. Depression anxiety  She complains of depression anxiety, previously under psychiatrist care, now not taking any medication but still have a lot of symptoms, in addition she has been treated for Graves' disease, hypothyroid disease for many years, on titrating dose of MethiMazole 50 mg today, TSH was still very low at 0.04 in October 2023  Since beginning of 2023 she noticed intermittent paresthesia, involving upper and lower extremity sometimes, she felt numbness of bilateral upper extremity," while pushing my arm, left arm felt numb, do not have anything there", the numbness can last for few days, she denies difficulty using her arm or leg,  In addition she complains of independent intermittent lower extremity paresthesia, lower  extremity can be burning, painful, sensitive to touch, lasting for few hours, she denies weakness, denies gait abnormality, no bowel bladder incontinence, no visual loss.   PHYSICAL EXAM:   Vitals:   12/03/22 0827  BP: (!) 153/80  Pulse: 72  Weight: 239 lb (108.4 kg)  Height: 5\' 7"  (1.702 m)   Not recorded     Body mass index is 37.43 kg/m.  PHYSICAL EXAMNIATION:  Gen: NAD, conversant, well nourised, well groomed                     Cardiovascular: Regular rate rhythm, no peripheral edema, warm, nontender. Eyes: Conjunctivae clear without exudates or hemorrhage Neck: Supple, no carotid bruits. Pulmonary: Clear to auscultation bilaterally   NEUROLOGICAL EXAM:  MENTAL STATUS: Speech/cognition: Awake, alert, oriented to history taking and casual conversation CRANIAL NERVES: CN II: Visual fields are full to confrontation. Pupils are round equal and briskly reactive to light. CN III, IV, VI: extraocular movement are normal. No ptosis.  Exophthalmos CN V: Facial sensation is intact to light touch CN VII: Face is symmetric with normal eye closure  CN VIII: Hearing is normal to causal conversation. CN IX, X: Phonation is normal. CN XI: Head turning and shoulder shrug are intact  MOTOR: There is no pronator drift of out-stretched arms. Muscle bulk and tone are normal. Muscle strength is normal.  REFLEXES: Reflexes are 2+ and symmetric at the biceps, triceps, knees, and ankles. Plantar responses are flexor.  SENSORY: Intact to light touch, pinprick and vibratory sensation are intact in fingers and toes.  COORDINATION: There is no trunk or limb dysmetria noted.  GAIT/STANCE: Posture is normal. Gait is steady with normal steps, base, arm swing, and turning. Heel and toe walking are normal. Tandem gait is normal.  Romberg is absent.  REVIEW OF SYSTEMS:  Full 14 system review of systems performed and notable only for as above All other review of systems were  negative.   ALLERGIES: No Known Allergies  HOME MEDICATIONS: Current Outpatient Medications  Medication Sig Dispense Refill   methimazole (TAPAZOLE) 10 MG tablet Take 5 tablets (50 mg total) by mouth daily. 450 tablet 1   Multiple Vitamin (MULTIVITAMIN ADULT PO) Take by mouth.     No current facility-administered medications for this visit.    PAST MEDICAL HISTORY: Past Medical History:  Diagnosis Date   Anxiety    Prediabetes 09/18/2022   Thyroid disease    Vitamin D deficiency 09/18/2022    PAST SURGICAL HISTORY: Past Surgical History:  Procedure Laterality Date   NO PAST SURGERIES      FAMILY HISTORY: Family History  Problem Relation Age of Onset   Neuropathy Mother    Thyroid disease Neg Hx     SOCIAL HISTORY: Social History   Socioeconomic History   Marital status: Single    Spouse name: Not on file   Number of children: Not on file   Years of education: Not on file   Highest education level: Not on file  Occupational History   Not on file  Tobacco Use   Smoking status: Never   Smokeless tobacco: Never  Substance and Sexual Activity   Alcohol use: Yes   Drug use: No   Sexual activity: Not on file  Other Topics Concern   Not on file  Social History Narrative   Caffeine X2    Left and right handed    Social Determinants of Health   Financial Resource Strain: Not on file  Food Insecurity: Not on file  Transportation Needs: Not on file  Physical Activity: Not on file  Stress: Not on file  Social Connections: Not on file  Intimate Partner Violence: Not on file      Sheri Brock, M.D. Ph.D.  [facility] [address] [place], [place] [address] Ph: [phone] Fax: [phone]  CC:  Sheri Shackleton, NP-C [address] [place],  [place] [address]  Sheri Shackleton, NP-C

## 2022-12-16 ENCOUNTER — Telehealth: Payer: Self-pay | Admitting: Neurology

## 2022-12-16 NOTE — Telephone Encounter (Signed)
Patient called me to cancelled MRI due to being sick

## 2022-12-17 ENCOUNTER — Ambulatory Visit: Payer: Self-pay | Admitting: Internal Medicine

## 2022-12-17 ENCOUNTER — Other Ambulatory Visit: Payer: 59

## 2023-02-17 ENCOUNTER — Other Ambulatory Visit: Payer: Self-pay | Admitting: Internal Medicine

## 2023-03-31 ENCOUNTER — Telehealth: Payer: Self-pay | Admitting: Internal Medicine

## 2023-03-31 ENCOUNTER — Ambulatory Visit (INDEPENDENT_AMBULATORY_CARE_PROVIDER_SITE_OTHER): Payer: 59 | Admitting: Internal Medicine

## 2023-03-31 ENCOUNTER — Encounter: Payer: Self-pay | Admitting: Internal Medicine

## 2023-03-31 VITALS — BP 134/80 | HR 94 | Ht 67.0 in | Wt 239.0 lb

## 2023-03-31 DIAGNOSIS — E0590 Thyrotoxicosis, unspecified without thyrotoxic crisis or storm: Secondary | ICD-10-CM

## 2023-03-31 DIAGNOSIS — E0500 Thyrotoxicosis with diffuse goiter without thyrotoxic crisis or storm: Secondary | ICD-10-CM | POA: Diagnosis not present

## 2023-03-31 LAB — T4, FREE: Free T4: 0.37 ng/dL — ABNORMAL LOW (ref 0.60–1.60)

## 2023-03-31 LAB — TSH: TSH: 30.72 u[IU]/mL — ABNORMAL HIGH (ref 0.35–5.50)

## 2023-03-31 NOTE — Progress Notes (Signed)
Name: Chantella Creech  MRN/ DOB: [id number], 07/05/1986    Age/ Sex: 38 y.o., female     PCP: Avanell Shackleton, NP-C   Reason for Endocrinology Evaluation: Hyperthyroidism     Initial Endocrinology Clinic Visit: 05/13/2018    PATIENT IDENTIFIER: Ms. Addilynne Olheiser is a 37 y.o., female with a past medical history of hyperthyroid. She has followed with [facility] since 05/13/2018 for consultative assistance with management of her hyperthyroidism.   HISTORICAL SUMMARY: The patient was first diagnosed with hyperthyroid in 2017.  She has been on thionamides therapy intermittently since 2018.  This has been attributed to Graves' disease Thyroid uptake and scan in May 2023 consistent with Graves' disease with a 24-hour I-131 63%     No Fh of thyroid disease   SUBJECTIVE:   Today (03/31/2023):  Ms. Somero is here for follow-up on hyperthyroidism.  Weight has been stable  Continue with  palpitations  Continues with tremors  No local neck swelling  Denies diarrhea  Has no burning or itching of the eyes  She admits to imperfect adherence especially during    She declines RAI ablation due to side effects    Methimazole 10 mg 5 tabs daily  HISTORY:  Past Medical History:  Past Medical History:  Diagnosis Date   Anxiety    Prediabetes 09/18/2022   Thyroid disease    Vitamin D deficiency 09/18/2022   Past Surgical History:  Past Surgical History:  Procedure Laterality Date   NO PAST SURGERIES     Social History:  reports that she has never smoked. She has never used smokeless tobacco. She reports current alcohol use. She reports that she does not use drugs. Family History:  Family History  Problem Relation Age of Onset   Neuropathy Mother    Thyroid disease Neg Hx      HOME MEDICATIONS: Allergies as of 03/31/2023   No Known Allergies      Medication List        Accurate as of March 31, 2023  1:46 PM. If you have any questions, ask your nurse or  doctor.          methimazole 10 MG tablet Commonly known as: TAPAZOLE TAKE 5 TABLETS BY MOUTH DAILY   MULTIVITAMIN ADULT PO Take by mouth.          OBJECTIVE:   PHYSICAL EXAM: VS: BP 134/80 (BP Location: Left Arm, Patient Position: Sitting, Cuff Size: Large)   Pulse 94   Ht  (1.702 m)   Wt 239 lb (108.4 kg)   SpO2 96%   BMI 37.43 kg/m    EXAM: General: Pt appears well and is in NAD  Eyes: External eye exam normal with a stare, right eye proptosis but no lid lag .  EOM intact.   Neck: General: Supple without adenopathy. Thyroid: Thyroid is prominent .  , no bruit.  Lungs: Clear with good BS bilat with no rales, rhonchi, or wheezes  Heart: Auscultation: RRR.  Abdomen: Normoactive bowel sounds, soft, nontender, without masses or organomegaly palpable  Extremities:  BL LE: No pretibial edema normal ROM and strength.  Mental Status: Judgment, insight: Intact Orientation: Oriented to time, place, and person Mood and affect: No depression, anxiety, or agitation     DATA REVIEWED:   Latest Reference Range & Units 03/31/23 10:03  TSH 0.35 - 5.50 uIU/mL 30.72 (H)  T4,Free(Direct) 0.60 - 1.60 ng/dL 5.62 (L)  (H): Data is abnormally high (L): Data is abnormally  low  Thyroid uptake and scan 05/03/2022  FINDINGS: Planar imaging of the thyroid demonstrates normal homogeneous radiotracer uptake throughout the thyroid without focal abnormality.   4 hour I-131 uptake = 47.6% (normal 5-20%)   24 hour I-131 uptake = 63.0% (normal 10-30%)   IMPRESSION: 1. Elevated iodine uptake values at 4 hours and 24 hours as above. 2. Homogeneous radiotracer uptake within the thyroid without focal abnormality noted.    ASSESSMENT / PLAN / RECOMMENDATIONS:   Hyperthyroidism  -Pt with hyperthyroid symptoms  -I have suggested dividing methimazole to twice daily in the past but unfortunately she will forget to take it if she does that. 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  I have encouraged her to consider total thyroidectomy. I did explain to her that with total thyroidectomy she will need lifelong LT-4 replacement -TSH elevated with low T4, will hold methimazole for 3 days and restart at a smaller dose as below   Medications  Decrease methimazole 10 mg to 3 tablets daily    2. Graves' Disease:  - Another referral has been placed to opthalmology  -Patient with Graves' orbitopathy    Follow-up in 6 months Labs in 1 month  Signed electronically by: Lyndle Herrlich, MD  [facility]  [facility] [address] [place] [place], [place] [address] Phone: [phone] FAX: [phone]      CC: Avanell Shackleton, NP-C [address] [place] [address] Phone: [phone]  Fax: [phone]   Return to Endocrinology clinic as below: Future Appointments  Date Time Provider Department Center  09/30/2023  3:00 PM Harith Mccadden, Konrad Dolores, MD [id number] None

## 2023-03-31 NOTE — Telephone Encounter (Signed)
Patient advised and lab appointment scheduled 

## 2023-03-31 NOTE — Telephone Encounter (Signed)
Can you please let the patient know that her thyroid test showed that she is on too much methimazole.   Patient needs to hold taking methimazole for 3 days, then restarted taking 3 tablets daily (instead of 5 tablets)   Please schedule her for repeat labs in 1 month  Thanks

## 2023-04-30 ENCOUNTER — Other Ambulatory Visit: Payer: 59

## 2023-05-02 ENCOUNTER — Telehealth: Payer: Self-pay | Admitting: Internal Medicine

## 2023-05-02 ENCOUNTER — Other Ambulatory Visit (INDEPENDENT_AMBULATORY_CARE_PROVIDER_SITE_OTHER): Payer: 59

## 2023-05-02 DIAGNOSIS — E0500 Thyrotoxicosis with diffuse goiter without thyrotoxic crisis or storm: Secondary | ICD-10-CM

## 2023-05-02 LAB — T4, FREE: Free T4: 0.29 ng/dL — ABNORMAL LOW (ref 0.60–1.60)

## 2023-05-02 LAB — TSH: TSH: 38.92 u[IU]/mL — ABNORMAL HIGH (ref 0.35–5.50)

## 2023-05-02 MED ORDER — METHIMAZOLE 10 MG PO TABS: 10.0000 mg | ORAL_TABLET | Freq: Every day | ORAL | 6 refills | Status: AC

## 2023-05-02 NOTE — Telephone Encounter (Signed)
Discussed lab results with the patient on 05/02/2023 at 1400   Patient continues with hypothyroidism   Latest Reference Range & Units 05/02/23 08:28  TSH 0.35 - 5.50 uIU/mL 38.92 (H)  T4,Free(Direct) 0.60 - 1.60 ng/dL 1.61 (L)  (H): Data is abnormally high (L): Data is abnormally low   She is currently on methimazole 3 tabs daily, patient advised to hold methimazole for the next 3 days, then restart at 1 tablet daily  Patient will return for lab appointment on Monday 6/24 at 3:45 PM

## 2023-05-30 ENCOUNTER — Other Ambulatory Visit: Payer: Self-pay

## 2023-05-30 DIAGNOSIS — E0590 Thyrotoxicosis, unspecified without thyrotoxic crisis or storm: Secondary | ICD-10-CM

## 2023-06-02 ENCOUNTER — Other Ambulatory Visit (INDEPENDENT_AMBULATORY_CARE_PROVIDER_SITE_OTHER): Payer: 59

## 2023-06-02 DIAGNOSIS — E0590 Thyrotoxicosis, unspecified without thyrotoxic crisis or storm: Secondary | ICD-10-CM | POA: Diagnosis not present

## 2023-06-03 LAB — T4, FREE: Free T4: 0.78 ng/dL (ref 0.60–1.60)

## 2023-06-03 LAB — TSH: TSH: 5.45 u[IU]/mL (ref 0.35–5.50)

## 2023-09-30 ENCOUNTER — Ambulatory Visit: Payer: 59 | Admitting: Internal Medicine

## 2024-01-14 ENCOUNTER — Ambulatory Visit: Payer: 59 | Admitting: Internal Medicine
